# Patient Record
Sex: Female | Born: 1940 | Race: White | Hispanic: No | Marital: Married | Smoking: Never smoker
Health system: Southern US, Community
[De-identification: ages and names within clinical notes are randomized; demographics above are authoritative.]

## PROBLEM LIST (undated history)

## (undated) DIAGNOSIS — F32A Depression, unspecified: Secondary | ICD-10-CM

## (undated) DIAGNOSIS — R0600 Dyspnea, unspecified: Secondary | ICD-10-CM

## (undated) DIAGNOSIS — M797 Fibromyalgia: Secondary | ICD-10-CM

## (undated) DIAGNOSIS — C50919 Malignant neoplasm of unspecified site of unspecified female breast: Secondary | ICD-10-CM

## (undated) DIAGNOSIS — R600 Localized edema: Secondary | ICD-10-CM

## (undated) DIAGNOSIS — F329 Major depressive disorder, single episode, unspecified: Secondary | ICD-10-CM

## (undated) DIAGNOSIS — F419 Anxiety disorder, unspecified: Secondary | ICD-10-CM

## (undated) DIAGNOSIS — R5382 Chronic fatigue, unspecified: Secondary | ICD-10-CM

## (undated) DIAGNOSIS — R0609 Other forms of dyspnea: Secondary | ICD-10-CM

## (undated) DIAGNOSIS — E785 Hyperlipidemia, unspecified: Secondary | ICD-10-CM

## (undated) DIAGNOSIS — K219 Gastro-esophageal reflux disease without esophagitis: Secondary | ICD-10-CM

## (undated) DIAGNOSIS — R079 Chest pain, unspecified: Secondary | ICD-10-CM

## (undated) HISTORY — DX: Malignant neoplasm of unspecified site of unspecified female breast: C50.919

## (undated) HISTORY — DX: Chest pain, unspecified: R07.9

## (undated) HISTORY — DX: Gastro-esophageal reflux disease without esophagitis: K21.9

## (undated) HISTORY — DX: Hyperlipidemia, unspecified: E78.5

## (undated) HISTORY — PX: OTHER SURGICAL HISTORY: SHX169

## (undated) HISTORY — DX: Fibromyalgia: M79.7

## (undated) HISTORY — DX: Chronic fatigue, unspecified: R53.82

## (undated) HISTORY — PX: TONSILLECTOMY: SUR1361

## (undated) HISTORY — DX: Other forms of dyspnea: R06.09

## (undated) HISTORY — DX: Dyspnea, unspecified: R06.00

## (undated) HISTORY — DX: Localized edema: R60.0

---

## 1962-11-10 HISTORY — PX: APPENDECTOMY: SHX54

## 1997-11-10 HISTORY — PX: MASTECTOMY: SHX3

## 2003-01-19 ENCOUNTER — Other Ambulatory Visit: Admission: RE | Admit: 2003-01-19 | Discharge: 2003-01-19 | Payer: Self-pay | Admitting: Dermatology

## 2004-02-20 ENCOUNTER — Encounter: Admission: RE | Admit: 2004-02-20 | Discharge: 2004-02-20 | Payer: Self-pay | Admitting: Internal Medicine

## 2009-02-16 ENCOUNTER — Encounter: Admission: RE | Admit: 2009-02-16 | Discharge: 2009-02-16 | Payer: Self-pay | Admitting: Internal Medicine

## 2009-05-15 ENCOUNTER — Encounter: Payer: Self-pay | Admitting: *Deleted

## 2010-03-05 ENCOUNTER — Encounter: Admission: RE | Admit: 2010-03-05 | Discharge: 2010-03-05 | Payer: Self-pay | Admitting: Internal Medicine

## 2010-10-18 ENCOUNTER — Ambulatory Visit (HOSPITAL_COMMUNITY)
Admission: RE | Admit: 2010-10-18 | Discharge: 2010-10-18 | Payer: Self-pay | Source: Home / Self Care | Attending: Internal Medicine | Admitting: Internal Medicine

## 2011-02-24 ENCOUNTER — Other Ambulatory Visit: Payer: Self-pay | Admitting: Internal Medicine

## 2011-02-24 DIAGNOSIS — Z1231 Encounter for screening mammogram for malignant neoplasm of breast: Secondary | ICD-10-CM

## 2011-02-24 DIAGNOSIS — Z9010 Acquired absence of unspecified breast and nipple: Secondary | ICD-10-CM

## 2011-03-17 ENCOUNTER — Ambulatory Visit
Admission: RE | Admit: 2011-03-17 | Discharge: 2011-03-17 | Disposition: A | Discharge status: Home / Self Care | Payer: Medicare Other | Source: Ambulatory Visit | Attending: Internal Medicine | Admitting: Internal Medicine

## 2011-03-17 DIAGNOSIS — Z1231 Encounter for screening mammogram for malignant neoplasm of breast: Secondary | ICD-10-CM

## 2011-03-17 DIAGNOSIS — Z9010 Acquired absence of unspecified breast and nipple: Secondary | ICD-10-CM

## 2012-02-06 ENCOUNTER — Encounter: Payer: Self-pay | Admitting: *Deleted

## 2012-02-11 ENCOUNTER — Encounter (HOSPITAL_COMMUNITY)
Admission: RE | Admit: 2012-02-11 | Discharge: 2012-02-11 | Disposition: A | Discharge status: Home / Self Care | Payer: Medicare Other | Source: Ambulatory Visit | Attending: Internal Medicine | Admitting: Internal Medicine

## 2012-02-11 ENCOUNTER — Encounter (HOSPITAL_COMMUNITY): Payer: Self-pay

## 2012-02-11 DIAGNOSIS — M899 Disorder of bone, unspecified: Secondary | ICD-10-CM | POA: Insufficient documentation

## 2012-02-11 MED ORDER — ZOLEDRONIC ACID 5 MG/100ML IV SOLN
5.0000 mg | Freq: Once | INTRAVENOUS | Status: AC
  Administered 2012-02-11: 5 mg via INTRAVENOUS

## 2012-02-11 MED ORDER — SODIUM CHLORIDE 0.9 % IV SOLN
INTRAVENOUS | Status: AC
  Administered 2012-02-11: 250 mL via INTRAVENOUS

## 2012-02-11 NOTE — Discharge Instructions (Signed)
Drink fluids/water as tolerated over next 72hrs Tylenol or Ibuprofen OTC as directed Continue calcium and Vit D as directed by your MD 

## 2012-08-18 ENCOUNTER — Other Ambulatory Visit: Payer: Self-pay | Admitting: Internal Medicine

## 2012-08-18 DIAGNOSIS — Z1231 Encounter for screening mammogram for malignant neoplasm of breast: Secondary | ICD-10-CM

## 2012-08-18 DIAGNOSIS — Z9011 Acquired absence of right breast and nipple: Secondary | ICD-10-CM

## 2012-08-24 ENCOUNTER — Other Ambulatory Visit: Payer: Self-pay | Admitting: Internal Medicine

## 2012-08-24 ENCOUNTER — Ambulatory Visit (HOSPITAL_COMMUNITY)
Admission: RE | Admit: 2012-08-24 | Discharge: 2012-08-24 | Disposition: A | Discharge status: Home / Self Care | Payer: Medicare Other | Source: Ambulatory Visit | Attending: Internal Medicine | Admitting: Internal Medicine

## 2012-08-24 DIAGNOSIS — Z9011 Acquired absence of right breast and nipple: Secondary | ICD-10-CM

## 2012-08-24 DIAGNOSIS — Z1231 Encounter for screening mammogram for malignant neoplasm of breast: Secondary | ICD-10-CM

## 2012-10-04 ENCOUNTER — Encounter: Payer: Self-pay | Admitting: Cardiovascular Disease

## 2012-10-04 ENCOUNTER — Ambulatory Visit (INDEPENDENT_AMBULATORY_CARE_PROVIDER_SITE_OTHER): Payer: Medicare Other | Admitting: Cardiovascular Disease

## 2012-10-04 VITALS — BP 122/82 | HR 81 | Ht 65.5 in | Wt 135.0 lb

## 2012-10-04 DIAGNOSIS — R079 Chest pain, unspecified: Secondary | ICD-10-CM

## 2012-10-04 DIAGNOSIS — R0789 Other chest pain: Secondary | ICD-10-CM

## 2012-10-04 NOTE — Patient Instructions (Addendum)
Your physician has requested that you have a stress echocardiogram.  Please follow instruction sheet as given.   Your physician recommends that you schedule a follow-up appointment in: 1 month   Your physician recommends that you continue on your current medications as directed. Please refer to the Current Medication list given to you today.

## 2012-10-04 NOTE — Progress Notes (Signed)
Berline Lopes Date of Birth  December 16, 1940       [facility]    [place] [address], [address]  [address] [place], [place]  [address]   [place], [place]  [address] [phone]     [phone]   Fax  [phone]    Fax [phone]  Problem List: 1. Chest pain 2. Hyperlipidemia 3. Hx of breast cancer 4. Fibromyalgia   History of Present Illness:  Diamond Bowers is a 71 year old female who presents today for further evaluation of some chest pain. She was seen in 2010 by Dr. Reyes Ivan. She had a negative stress echocardiogram at that time.  These occur typically at rest.  The pains tend to radiate up to her jaw and into her arm.  The pains last up to an hour.  They are not associated with exertion.  They are not associated with eating or drinking.  She has GERD and avoids tomatoes and spicy foods.   Current Outpatient Prescriptions on File Prior to Visit  Medication Sig Dispense Refill  . atorvastatin (LIPITOR) 40 MG tablet Take 40 mg by mouth daily.      . Beclomethasone Dipropionate (QVAR IN) Inhale 80 mcg into the lungs.      . budesonide-formoterol (SYMBICORT) 160-4.5 MCG/ACT inhaler Inhale 2 puffs into the lungs 2 (two) times daily.      . calcium-vitamin D (OSCAL WITH D) 500-200 MG-UNIT per tablet Take 1 tablet by mouth 2 (two) times daily.      . colesevelam (WELCHOL) 625 MG tablet Take 1,875 mg by mouth 2 (two) times daily with a meal.      . esomeprazole (NEXIUM) 40 MG capsule Take 40 mg by mouth daily before breakfast.      . fexofenadine (ALLEGRA) 180 MG tablet Take 180 mg by mouth daily.      . fish oil-omega-3 fatty acids 1000 MG capsule Take 1 g by mouth 2 (two) times daily.      . montelukast (SINGULAIR) 10 MG tablet Take 10 mg by mouth at bedtime.      . Multiple Vitamin (MULTIVITAMIN) capsule Take 1 capsule by mouth daily.      Marland Kitchen zolpidem (AMBIEN) 10 MG tablet Take 10 mg by mouth at bedtime as needed.        Allergies  Allergen  Reactions  . Statins     Past Medical History  Diagnosis Date  . Asthma   . GERD (gastroesophageal reflux disease)   . Dyslipidemia   . Fibromyalgia   . Breast cancer     H/O, s/p RIGHT MASTECTOMY  . Chest pain, unspecified   . Dyspnea   . Dyspnea on exertion   . Chronic fatigue   . Edema of lower extremity   . Osteoporosis     Past Surgical History  Procedure Date  . Mastectomy     RIGHT BREAST    History  Smoking status  . Unknown If Ever Smoked  Smokeless tobacco  . Not on file    History  Alcohol Use     Family History  Problem Relation Age of Onset  . Stroke Mother   . Hypertension Mother   . Heart disease Father 71    Reviw of Systems:  Reviewed in the HPI.  All other systems are negative.  Physical Exam: Blood pressure 122/82, pulse 81, height 5' 5.5" (1.664 m), weight 135 lb (61.236 kg), SpO2 98.00%. General: Well developed, well nourished, in no  acute distress.  Head: Normocephalic, atraumatic, sclera non-icteric, mucus membranes are moist,   Neck: Supple. Carotids are 2 + without bruits. No JVD   Lungs: Clear   Heart: RR, S1, S2, no significant murmur  Abdomen: Soft, non-tender, non-distended with normal bowel sounds.  Msk:  Strength and tone are normal  Extremities: No clubbing or cyanosis. No edema.  Distal pedal pulses are 2+ and equal    Neuro: CN II - XII intact.  Alert and oriented X 3.   Psych:  Normal   ECG: Nov. 25, 2013 - NSR at 70. No ST or T wave changes  Assessment / Plan:

## 2012-10-04 NOTE — Assessment & Plan Note (Signed)
Destry presents with recurrent chest tightness.  She has had a negative stress echo in the past.  Her resting ECG shows no abnormalities.   Given the character of the chest discomfort, we will order a stress echocardiogram.  She exercises regularly and should be able to do a stress test without any significant difficulty.   I will see her in 1 month for follow up.

## 2012-10-28 ENCOUNTER — Other Ambulatory Visit: Payer: Self-pay

## 2012-10-28 ENCOUNTER — Other Ambulatory Visit: Payer: Self-pay | Admitting: *Deleted

## 2012-10-28 DIAGNOSIS — R079 Chest pain, unspecified: Secondary | ICD-10-CM

## 2012-10-28 DIAGNOSIS — R0789 Other chest pain: Secondary | ICD-10-CM

## 2012-10-29 ENCOUNTER — Other Ambulatory Visit (HOSPITAL_COMMUNITY): Payer: Medicare Other

## 2012-10-29 ENCOUNTER — Ambulatory Visit (HOSPITAL_COMMUNITY): Payer: Medicare Other | Attending: Cardiology

## 2012-10-29 DIAGNOSIS — Z853 Personal history of malignant neoplasm of breast: Secondary | ICD-10-CM | POA: Insufficient documentation

## 2012-10-29 DIAGNOSIS — R5381 Other malaise: Secondary | ICD-10-CM | POA: Insufficient documentation

## 2012-10-29 DIAGNOSIS — R5383 Other fatigue: Secondary | ICD-10-CM | POA: Insufficient documentation

## 2012-10-29 DIAGNOSIS — R0789 Other chest pain: Secondary | ICD-10-CM

## 2012-10-29 DIAGNOSIS — E785 Hyperlipidemia, unspecified: Secondary | ICD-10-CM | POA: Insufficient documentation

## 2012-10-29 DIAGNOSIS — R609 Edema, unspecified: Secondary | ICD-10-CM | POA: Insufficient documentation

## 2012-10-29 DIAGNOSIS — IMO0001 Reserved for inherently not codable concepts without codable children: Secondary | ICD-10-CM | POA: Insufficient documentation

## 2012-10-29 DIAGNOSIS — Z9010 Acquired absence of unspecified breast and nipple: Secondary | ICD-10-CM | POA: Insufficient documentation

## 2012-10-29 DIAGNOSIS — R079 Chest pain, unspecified: Secondary | ICD-10-CM

## 2012-10-29 DIAGNOSIS — I359 Nonrheumatic aortic valve disorder, unspecified: Secondary | ICD-10-CM | POA: Insufficient documentation

## 2012-10-29 DIAGNOSIS — R072 Precordial pain: Secondary | ICD-10-CM

## 2012-10-29 NOTE — Progress Notes (Signed)
Echocardiogram performed.  

## 2012-11-08 ENCOUNTER — Ambulatory Visit (INDEPENDENT_AMBULATORY_CARE_PROVIDER_SITE_OTHER): Payer: Medicare Other | Admitting: Physician Assistant

## 2012-11-08 DIAGNOSIS — R9439 Abnormal result of other cardiovascular function study: Secondary | ICD-10-CM

## 2012-11-08 DIAGNOSIS — R079 Chest pain, unspecified: Secondary | ICD-10-CM

## 2012-11-08 DIAGNOSIS — R0789 Other chest pain: Secondary | ICD-10-CM

## 2012-11-08 NOTE — Patient Instructions (Addendum)
Your physician has requested that you have a lexiscan myoview. For further information please visit [email or web address] Please follow instruction sheet, as given. HAVE STRESS TEST DONE BEFORE APPOINTMENT WITH DR. Elease Hashimoto PER SCOTT WEAVER  KEEP APPOINTMENT WITH DR. Elease Hashimoto

## 2012-11-08 NOTE — Progress Notes (Signed)
Exercise Treadmill Test  Pre-Exercise Testing Evaluation Rhythm: normal sinus  Rate: 74                 Test  Exercise Tolerance Test Ordering MD: Rodrigo Ran, MD  Interpreting MD: Tereso Newcomer, PA-C  Unique Test No: 1  Treadmill:  1  Indication for ETT: chest pain - rule out ischemia  Contraindication to ETT: No   Stress Modality: exercise - treadmill  Cardiac Imaging Performed: non   Protocol: standard Bruce - maximal  Max BP:  225/102  Max MPHR (bpm):  147 85% MPR (bpm):  125  MPHR obtained (bpm):  142 % MPHR obtained:  96  Reached 85% MPHR (min:sec):  1:10 Total Exercise Time (min-sec):  2:00  Workload in METS:  4.4 Borg Scale: 15  Reason ETT Terminated:  fatigue    ST Segment Analysis At Rest: normal ST segments - no evidence of significant ST depression With Exercise: borderline ST changes  Other Information Arrhythmia:  No Angina during ETT:  absent (0) Quality of ETT:  indeterminate  ETT Interpretation:  borderline (indeterminate) with non-specific ST changes  Comments: Poor exercise tolerance. No chest pain.  She did complain of dyspnea. Hypertensive BP response to exercise.  Baseline BP elevated 168/98. Borderline ST depression in inf-lat lead.  Cannot rule out ischemia.   Recommendations: Schedule Lexiscan Myoview. Suspect she has "white coat HTN." Keep follow up with Dr. Delane Ginger next week. Luna Glasgow, PA-C  11:06 AM 11/08/2012

## 2012-11-09 ENCOUNTER — Ambulatory Visit (HOSPITAL_COMMUNITY): Payer: Medicare Other | Attending: Cardiovascular Disease | Admitting: Radiology

## 2012-11-09 VITALS — BP 157/93 | Ht 65.0 in | Wt 139.0 lb

## 2012-11-09 DIAGNOSIS — R079 Chest pain, unspecified: Secondary | ICD-10-CM

## 2012-11-09 DIAGNOSIS — R42 Dizziness and giddiness: Secondary | ICD-10-CM | POA: Insufficient documentation

## 2012-11-09 DIAGNOSIS — R0602 Shortness of breath: Secondary | ICD-10-CM

## 2012-11-09 DIAGNOSIS — R0609 Other forms of dyspnea: Secondary | ICD-10-CM | POA: Insufficient documentation

## 2012-11-09 DIAGNOSIS — R0989 Other specified symptoms and signs involving the circulatory and respiratory systems: Secondary | ICD-10-CM | POA: Insufficient documentation

## 2012-11-09 DIAGNOSIS — J45909 Unspecified asthma, uncomplicated: Secondary | ICD-10-CM | POA: Insufficient documentation

## 2012-11-09 DIAGNOSIS — R9439 Abnormal result of other cardiovascular function study: Secondary | ICD-10-CM

## 2012-11-09 MED ORDER — REGADENOSON 0.4 MG/5ML IV SOLN
0.4000 mg | Freq: Once | INTRAVENOUS | Status: AC
  Administered 2012-11-09: 0.4 mg via INTRAVENOUS

## 2012-11-09 MED ORDER — TECHNETIUM TC 99M SESTAMIBI GENERIC - CARDIOLITE
10.0000 | Freq: Once | INTRAVENOUS | Status: AC | PRN
  Administered 2012-11-09: 10 via INTRAVENOUS

## 2012-11-09 MED ORDER — TECHNETIUM TC 99M SESTAMIBI GENERIC - CARDIOLITE
30.0000 | Freq: Once | INTRAVENOUS | Status: AC | PRN
  Administered 2012-11-09: 30 via INTRAVENOUS

## 2012-11-09 NOTE — Progress Notes (Signed)
[facility] SITE [address] NUCLEAR MED [address] [place], [place] [address] [phone]    Cardiology Nuclear Med Study  KENNETTE CUTHRELL is a 71 y.o. female     MRN : [id number]     DOB: May 20, 1941  Procedure Date: 11/09/2012  Nuclear Med Background Indication for Stress Test:  Evaluation for Ischemia and Abnormal GXT History:  12/13 Echo-EF 55-65%; 12/13 GXT-poor exerc. Morene Antu; hx R Breast Ca;Asthma Cardiac Risk Factors: Lipids  Symptoms:  Chest Pain (last date of chest discomfort now 3/10),Jaw pain(last date of pain last night), Diaphoresis, Dizziness, DOE and Fatigue   Nuclear Pre-Procedure Caffeine/Decaff Intake:  None NPO After: 6:00pm   Lungs:  clear O2 Sat: 98% on room air. IV 0.9% NS with Angio Cath:  22g  IV Site: L Antecubital  IV Started by:  Doyne Keel, CNMT  Chest Size (in):  34 Cup Size: B  Height: 5\' 5"  (1.651 m)  Weight:  139 lb (63.05 kg)  BMI:  Body mass index is 23.13 kg/(m^2). Tech Comments:  Held all am meds; patient has right side breast implant    Nuclear Med Study 1 or 2 day study: 1 day  Stress Test Type:  Treadmill/Lexiscan  Reading MD: Kristeen Miss, MD  Order Authorizing Provider:  Katherina Right, MD  Resting Radionuclide: Technetium 98m Sestamibi  Resting Radionuclide Dose: 11.0 mCi   Stress Radionuclide:  Technetium 57m Sestamibi  Stress Radionuclide Dose: 33.0 mCi           Stress Protocol Rest HR: 77 Stress HR: 129  Rest BP: 157/93 Stress BP: 184/86  Exercise Time (min): 2:00 METS: 1.6   Predicted Max HR: 147 bpm % Max HR: 87.76 bpm Rate Pressure Product: 21308    Dose of Adenosine (mg):  n/a Dose of Lexiscan: 0.4 mg  Dose of Atropine (mg): n/a Dose of Dobutamine: n/a mcg/kg/min (at max HR)  Stress Test Technologist: Cathlyn Parsons, RN  Nuclear Technologist:  Doyne Keel, CNMT     Rest Procedure:  Myocardial perfusion imaging was performed at rest 45 minutes following the intravenous administration of Technetium 52m  Sestamibi. Rest ECG: NSR - Normal EKG  Stress Procedure:  The patient received IV Lexiscan 0.4 mg over 15-seconds with concurrent low level exercise and then Technetium 46m Sestamibi was injected at 30-seconds while the patient continued walking one more minute.  Quantitative spect images were obtained after a 45-minute delay. Stress ECG: No significant change from baseline ECG  QPS Raw Data Images:  Normal; no motion artifact; normal heart/lung ratio. Stress Images:  Normal homogeneous uptake in all areas of the myocardium. Rest Images:  Normal homogeneous uptake in all areas of the myocardium. Subtraction (SDS):  No evidence of ischemia. Transient Ischemic Dilatation (Normal <1.22):  1.15 Lung/Heart Ratio (Normal <0.45):  0.29  Quantitative Gated Spect Images QGS EDV:  62 ml QGS ESV:  19 ml  Impression Exercise Capacity:  Lexiscan with no exercise. BP Response:  Normal blood pressure response. Clinical Symptoms:  No significant symptoms noted. ECG Impression:  No significant ST segment change suggestive of ischemia. Comparison with Prior Nuclear Study: No images to compare  Overall Impression:  Normal stress nuclear study.  No evidence of ischemia.    LV Ejection Fraction: 69%.  LV Wall Motion:  NL LV Function; NL Wall Motion.    Vesta Mixer, Montez Hageman., MD, [facility] 11/09/2012, 4:03 PM Office - [phone] Pager [phone]

## 2012-11-12 ENCOUNTER — Encounter: Payer: Self-pay | Admitting: Physician Assistant

## 2012-11-15 ENCOUNTER — Encounter: Payer: Self-pay | Admitting: Cardiovascular Disease

## 2012-11-15 ENCOUNTER — Ambulatory Visit (INDEPENDENT_AMBULATORY_CARE_PROVIDER_SITE_OTHER): Payer: Medicare Other | Admitting: Cardiovascular Disease

## 2012-11-15 VITALS — BP 149/78 | HR 78 | Ht 65.0 in | Wt 139.0 lb

## 2012-11-15 DIAGNOSIS — R0789 Other chest pain: Secondary | ICD-10-CM

## 2012-11-15 NOTE — Patient Instructions (Addendum)
Your physician recommends that you schedule a follow-up appointment in: as needed basis, make follow up appointments with primary care

## 2012-11-15 NOTE — Assessment & Plan Note (Signed)
Diamond Bowers is doing well. She had a stress Myoview study that was normal. She also had an echocardiogram that was essentially normal except for some mild left ventricular hypertrophy.  She has a history of hyperlipidemia managed by Dr. Waynard Edwards. She did not want to take atorvastatin because of the LAD that she's on television. I informed her that Lipitor may cause some increase in blood glucose levels but that it seemed to offer benefit in the reduction of myocardial infarction, death, and stroke. I told her I would leave the final decision up to Dr. Waynard Edwards.  I will see her on an as-needed basis. I'll be happy to see her if she has any recurrent episodes of chest pain.

## 2012-11-15 NOTE — Progress Notes (Signed)
Diamond Bowers Date of Birth  22-May-1941       [facility]    [place] [address]  [address] [place], [place]  [address]   [place], [place]  [address] [phone]     [phone]   Fax  [phone]    Fax [phone]  Problem List: 1. Chest pain 2. Hyperlipidemia 3. Hx of breast cancer 4. Fibromyalgia   History of Present Illness:  Diamond Bowers is a 72 year old female who presents today for further evaluation of some chest pain. She was seen in 2010 by Dr. Reyes Ivan. She had a negative stress echocardiogram at that time.    November 16, 2011: She had been having some episodes of chest pain. A stress Myoview study was normal. She also had an echocardiogram that revealed mild left ventricular hypertrophy but was otherwise normal. She has a history of hyperlipidemia. We tried her on some Lipitor but she discontinued it.  She had seen a TV add and did not want to take it.   She's feeling quite well at this point. She's not having any further episodes of chest pain.  Current Outpatient Prescriptions on File Prior to Visit  Medication Sig Dispense Refill  . Beclomethasone Dipropionate (QVAR IN) Inhale 80 mcg into the lungs.      . budesonide-formoterol (SYMBICORT) 160-4.5 MCG/ACT inhaler Inhale 2 puffs into the lungs 2 (two) times daily.      . calcium-vitamin D (OSCAL WITH D) 500-200 MG-UNIT per tablet Take 1 tablet by mouth 2 (two) times daily.      . colesevelam (WELCHOL) 625 MG tablet Take 1,875 mg by mouth 2 (two) times daily with a meal.      . esomeprazole (NEXIUM) 40 MG capsule Take 40 mg by mouth daily before breakfast.      . fexofenadine (ALLEGRA) 180 MG tablet Take 180 mg by mouth daily.      . fish oil-omega-3 fatty acids 1000 MG capsule Take 1 g by mouth 2 (two) times daily.      . montelukast (SINGULAIR) 10 MG tablet Take 10 mg by mouth at bedtime.      . Multiple Vitamin (MULTIVITAMIN) capsule Take 1 capsule by mouth daily.       Marland Kitchen zolpidem (AMBIEN) 10 MG tablet Take 10 mg by mouth at bedtime as needed.        Allergies  Allergen Reactions  . Statins     Past Medical History  Diagnosis Date  . Asthma   . GERD (gastroesophageal reflux disease)   . Dyslipidemia   . Fibromyalgia   . Breast cancer     H/O, s/p RIGHT MASTECTOMY  . Chest pain, unspecified     ETT/Lex MV 1/14:  EF 69%, no ischemia  . Dyspnea   . Dyspnea on exertion   . Chronic fatigue   . Edema of lower extremity   . Osteoporosis     Past Surgical History  Procedure Date  . Mastectomy     RIGHT BREAST    History  Smoking status  . Never Smoker   Smokeless tobacco  . Not on file    History  Alcohol Use: Not on file    Family History  Problem Relation Age of Onset  . Stroke Mother   . Hypertension Mother   . Heart disease Father 55    Reviw of Systems:  Reviewed in the HPI.  All other systems are negative.  Physical Exam:  Blood pressure 149/78, pulse 78, height 5\' 5"  (1.651 m), weight 139 lb (63.05 kg). General: Well developed, well nourished, in no acute distress.  Head: Normocephalic, atraumatic, sclera non-icteric, mucus membranes are moist,   Neck: Supple. Carotids are 2 + without bruits. No JVD   Lungs: Clear   Heart: RR, S1, S2, no significant murmur  Abdomen: Soft, non-tender, non-distended with normal bowel sounds.  Msk:  Strength and tone are normal  Extremities: No clubbing or cyanosis. No edema.  Distal pedal pulses are 2+ and equal    Neuro: CN II - XII intact.  Alert and oriented X 3.   Psych:  Normal   ECG: Nov. 25, 2013 - NSR at 70. No ST or T wave changes  Assessment / Plan:

## 2013-08-08 ENCOUNTER — Other Ambulatory Visit: Payer: Self-pay

## 2013-08-08 DIAGNOSIS — Z9011 Acquired absence of right breast and nipple: Secondary | ICD-10-CM

## 2013-08-08 DIAGNOSIS — Z853 Personal history of malignant neoplasm of breast: Secondary | ICD-10-CM

## 2013-08-08 DIAGNOSIS — Z1231 Encounter for screening mammogram for malignant neoplasm of breast: Secondary | ICD-10-CM

## 2013-09-07 ENCOUNTER — Ambulatory Visit
Admission: RE | Admit: 2013-09-07 | Discharge: 2013-09-07 | Disposition: A | Discharge status: Home / Self Care | Payer: Medicare Other | Source: Ambulatory Visit

## 2013-09-07 DIAGNOSIS — Z9011 Acquired absence of right breast and nipple: Secondary | ICD-10-CM

## 2013-09-07 DIAGNOSIS — Z853 Personal history of malignant neoplasm of breast: Secondary | ICD-10-CM

## 2013-09-07 DIAGNOSIS — Z1231 Encounter for screening mammogram for malignant neoplasm of breast: Secondary | ICD-10-CM

## 2014-05-25 ENCOUNTER — Encounter (HOSPITAL_COMMUNITY): Payer: Self-pay

## 2014-05-25 ENCOUNTER — Encounter (HOSPITAL_COMMUNITY)
Admission: RE | Admit: 2014-05-25 | Discharge: 2014-05-25 | Disposition: A | Discharge status: Home / Self Care | Payer: Medicare Other | Source: Ambulatory Visit | Attending: Internal Medicine | Admitting: Internal Medicine

## 2014-05-25 DIAGNOSIS — M810 Age-related osteoporosis without current pathological fracture: Secondary | ICD-10-CM | POA: Insufficient documentation

## 2014-05-25 MED ORDER — ZOLEDRONIC ACID 5 MG/100ML IV SOLN
5.0000 mg | Freq: Once | INTRAVENOUS | Status: AC
Start: 2014-05-25 — End: 2014-05-25
  Administered 2014-05-25: 5 mg via INTRAVENOUS
  Filled 2014-05-25: qty 100

## 2014-05-25 MED ORDER — SODIUM CHLORIDE 0.9 % IV SOLN
INTRAVENOUS | Status: AC
  Administered 2014-05-25: 250 mL via INTRAVENOUS

## 2014-05-25 NOTE — Discharge Instructions (Signed)
RECLAST °Zoledronic Acid injection (Paget's Disease, Osteoporosis) °What is this medicine? °ZOLEDRONIC ACID (ZOE le dron ik AS id) lowers the amount of calcium loss from bone. It is used to treat Paget's disease and osteoporosis in women. °This medicine may be used for other purposes; ask your health care provider or pharmacist if you have questions. °COMMON BRAND NAME(S): Reclast, Zometa °What should I tell my health care provider before I take this medicine? °They need to know if you have any of these conditions: °-aspirin-sensitive asthma °-cancer, especially if you are receiving medicines used to treat cancer °-dental disease or wear dentures °-infection °-kidney disease °-low levels of calcium in the blood °-past surgery on the parathyroid gland or intestines °-receiving corticosteroids like dexamethasone or prednisone °-an unusual or allergic reaction to zoledronic acid, other medicines, foods, dyes, or preservatives °-pregnant or trying to get pregnant °-breast-feeding °How should I use this medicine? °This medicine is for infusion into a vein. It is given by a health care professional in a hospital or clinic setting. °Talk to your pediatrician regarding the use of this medicine in children. This medicine is not approved for use in children. °Overdosage: If you think you have taken too much of this medicine contact a poison control center or emergency room at once. °NOTE: This medicine is only for you. Do not share this medicine with others. °What if I miss a dose? °It is important not to miss your dose. Call your doctor or health care professional if you are unable to keep an appointment. °What may interact with this medicine? °-certain antibiotics given by injection °-NSAIDs, medicines for pain and inflammation, like ibuprofen or naproxen °-some diuretics like bumetanide, furosemide °-teriparatide °This list may not describe all possible interactions. Give your health care provider a list of all the  medicines, herbs, non-prescription drugs, or dietary supplements you use. Also tell them if you smoke, drink alcohol, or use illegal drugs. Some items may interact with your medicine. °What should I watch for while using this medicine? °Visit your doctor or health care professional for regular checkups. It may be some time before you see the benefit from this medicine. Do not stop taking your medicine unless your doctor tells you to. Your doctor may order blood tests or other tests to see how you are doing. °Women should inform their doctor if they wish to become pregnant or think they might be pregnant. There is a potential for serious side effects to an unborn child. Talk to your health care professional or pharmacist for more information. °You should make sure that you get enough calcium and vitamin D while you are taking this medicine. Discuss the foods you eat and the vitamins you take with your health care professional. °Some people who take this medicine have severe bone, joint, and/or muscle pain. This medicine may also increase your risk for jaw problems or a broken thigh bone. Tell your doctor right away if you have severe pain in your jaw, bones, joints, or muscles. Tell your doctor if you have any pain that does not go away or that gets worse. °Tell your dentist and dental surgeon that you are taking this medicine. You should not have major dental surgery while on this medicine. See your dentist to have a dental exam and fix any dental problems before starting this medicine. Take good care of your teeth while on this medicine. Make sure you see your dentist for regular follow-up appointments. °What side effects may I notice from receiving this   medicine? °Side effects that you should report to your doctor or health care professional as soon as possible: °-allergic reactions like skin rash, itching or hives, swelling of the face, lips, or tongue °-anxiety, confusion, or depression °-breathing  problems °-changes in vision °-eye pain °-feeling faint or lightheaded, falls °-jaw pain, especially after dental work °-mouth sores °-muscle cramps, stiffness, or weakness °-trouble passing urine or change in the amount of urine °Side effects that usually do not require medical attention (report to your doctor or health care professional if they continue or are bothersome): °-bone, joint, or muscle pain °-constipation °-diarrhea °-fever °-hair loss °-irritation at site where injected °-loss of appetite °-nausea, vomiting °-stomach upset °-trouble sleeping °-trouble swallowing °-weak or tired °This list may not describe all possible side effects. Call your doctor for medical advice about side effects. You may report side effects to FDA at 1-800-FDA-1088. °Where should I keep my medicine? °This drug is given in a hospital or clinic and will not be stored at home. °NOTE: This sheet is a summary. It may not cover all possible information. If you have questions about this medicine, talk to your doctor, pharmacist, or health care provider. °© 2015, Elsevier/Gold Standard. (2013-04-11 10:03:48) °Osteoporosis °Throughout your life, your body breaks down old bone and replaces it with new bone. As you get older, your body does not replace bone as quickly as it breaks it down. By the age of 30 years, most people begin to gradually lose bone because of the imbalance between bone loss and replacement. Some people lose more bone than others. Bone loss beyond a specified normal degree is considered osteoporosis.  °Osteoporosis affects the strength and durability of your bones. The inside of the ends of your bones and your flat bones, like the bones of your pelvis, look like honeycomb, filled with tiny open spaces. As bone loss occurs, your bones become less dense. This means that the open spaces inside your bones become bigger and the walls between these spaces become thinner. This makes your bones weaker. Bones of a person with  osteoporosis can become so weak that they can break (fracture) during minor accidents, such as a simple fall. °CAUSES  °The following factors have been associated with the development of osteoporosis: °· Smoking. °· Drinking more than 2 alcoholic drinks several days per week. °· Long-term use of certain medicines: °¨ Corticosteroids. °¨ Chemotherapy medicines. °¨ Thyroid medicines. °¨ Antiepileptic medicines. °¨ Gonadal hormone suppression medicine. °¨ Immunosuppression medicine. °· Being underweight. °· Lack of physical activity. °· Lack of exposure to the sun. This can lead to vitamin D deficiency. °· Certain medical conditions: °¨ Certain inflammatory bowel diseases, such as Crohn disease and ulcerative colitis. °¨ Diabetes. °¨ Hyperthyroidism. °¨ Hyperparathyroidism. °RISK FACTORS °Anyone can develop osteoporosis. However, the following factors can increase your risk of developing osteoporosis: °· Gender--Women are at higher risk than men. °· Age--Being older than 50 years increases your risk. °· Ethnicity--White and Asian people have an increased risk. °· Weight --Being extremely underweight can increase your risk of osteoporosis. °· Family history of osteoporosis--Having a family member who has developed osteoporosis can increase your risk. °SYMPTOMS  °Usually, people with osteoporosis have no symptoms.  °DIAGNOSIS  °Signs during a physical exam that may prompt your caregiver to suspect osteoporosis include: °· Decreased height. This is usually caused by the compression of the bones that form your spine (vertebrae) because they have weakened and become fractured. °· A curving or rounding of the upper back (kyphosis). °  To confirm signs of osteoporosis, your caregiver may request a procedure that uses 2 low-dose X-ray beams with different levels of energy to measure your bone mineral density (dual-energy X-ray absorptiometry [DXA]). Also, your caregiver may check your level of vitamin D. °TREATMENT  °The goal of  osteoporosis treatment is to strengthen bones in order to decrease the risk of bone fractures. There are different types of medicines available to help achieve this goal. Some of these medicines work by slowing the processes of bone loss. Some medicines work by increasing bone density. Treatment also involves making sure that your levels of calcium and vitamin D are adequate. °PREVENTION  °There are things you can do to help prevent osteoporosis. Adequate intake of calcium and vitamin D can help you achieve optimal bone mineral density. Regular exercise can also help, especially resistance and weight-bearing activities. If you smoke, quitting smoking is an important part of osteoporosis prevention. °MAKE SURE YOU: °· Understand these instructions. °· Will watch your condition. °· Will get help right away if you are not doing well or get worse. °FOR MORE INFORMATION °www.osteo.org and www.nof.org °Document Released: 08/06/2005 Document Revised: 02/21/2013 Document Reviewed: 10/11/2011 °ExitCare® Patient Information ©2015 [employer]. This information is not intended to replace advice given to you by your health care provider. Make sure you discuss any questions you have with your health care provider. ° ° °

## 2014-09-20 ENCOUNTER — Other Ambulatory Visit (HOSPITAL_COMMUNITY): Payer: Self-pay | Admitting: Internal Medicine

## 2014-09-20 DIAGNOSIS — Z1231 Encounter for screening mammogram for malignant neoplasm of breast: Secondary | ICD-10-CM

## 2014-09-22 ENCOUNTER — Ambulatory Visit (HOSPITAL_COMMUNITY)
Admission: RE | Admit: 2014-09-22 | Discharge: 2014-09-22 | Disposition: A | Discharge status: Home / Self Care | Payer: Medicare Other | Source: Ambulatory Visit | Attending: Internal Medicine | Admitting: Internal Medicine

## 2014-09-22 DIAGNOSIS — Z1231 Encounter for screening mammogram for malignant neoplasm of breast: Secondary | ICD-10-CM | POA: Diagnosis present

## 2015-08-11 HISTORY — PX: LUMBAR DISC SURGERY: SHX700

## 2015-11-12 ENCOUNTER — Other Ambulatory Visit (HOSPITAL_COMMUNITY): Payer: Self-pay | Admitting: Internal Medicine

## 2015-11-12 DIAGNOSIS — Z1231 Encounter for screening mammogram for malignant neoplasm of breast: Secondary | ICD-10-CM

## 2015-11-14 ENCOUNTER — Other Ambulatory Visit (HOSPITAL_COMMUNITY): Payer: Self-pay | Admitting: Internal Medicine

## 2015-11-14 ENCOUNTER — Ambulatory Visit (HOSPITAL_COMMUNITY)
Admission: RE | Admit: 2015-11-14 | Discharge: 2015-11-14 | Disposition: A | Discharge status: Home / Self Care | Payer: Medicare PPO | Source: Ambulatory Visit | Attending: Internal Medicine | Admitting: Internal Medicine

## 2015-11-14 ENCOUNTER — Ambulatory Visit (HOSPITAL_COMMUNITY): Payer: Self-pay

## 2015-11-14 DIAGNOSIS — Z1231 Encounter for screening mammogram for malignant neoplasm of breast: Secondary | ICD-10-CM

## 2016-03-03 ENCOUNTER — Other Ambulatory Visit (HOSPITAL_COMMUNITY): Payer: Self-pay | Admitting: *Deleted

## 2016-03-04 ENCOUNTER — Ambulatory Visit (HOSPITAL_COMMUNITY): Admission: RE | Admit: 2016-03-04 | Payer: Medicare PPO | Source: Ambulatory Visit

## 2016-03-06 ENCOUNTER — Other Ambulatory Visit (HOSPITAL_COMMUNITY): Payer: Self-pay | Admitting: *Deleted

## 2016-03-07 ENCOUNTER — Ambulatory Visit (HOSPITAL_COMMUNITY)
Admission: RE | Admit: 2016-03-07 | Discharge: 2016-03-07 | Disposition: A | Discharge status: Home / Self Care | Payer: Medicare PPO | Source: Ambulatory Visit | Attending: Internal Medicine | Admitting: Internal Medicine

## 2016-03-07 DIAGNOSIS — M810 Age-related osteoporosis without current pathological fracture: Secondary | ICD-10-CM | POA: Diagnosis not present

## 2016-03-07 MED ORDER — ZOLEDRONIC ACID 5 MG/100ML IV SOLN
INTRAVENOUS | Status: AC
  Administered 2016-03-07: 5 mg via INTRAVENOUS
  Filled 2016-03-07: qty 100

## 2016-03-07 MED ORDER — ZOLEDRONIC ACID 5 MG/100ML IV SOLN
5.0000 mg | Freq: Once | INTRAVENOUS | Status: AC
  Administered 2016-03-07: 5 mg via INTRAVENOUS

## 2016-06-17 ENCOUNTER — Other Ambulatory Visit: Payer: Self-pay | Admitting: Internal Medicine

## 2016-06-17 DIAGNOSIS — I350 Nonrheumatic aortic (valve) stenosis: Secondary | ICD-10-CM

## 2016-06-19 ENCOUNTER — Ambulatory Visit (HOSPITAL_COMMUNITY): Payer: Medicare PPO | Attending: Cardiovascular Disease

## 2016-06-19 ENCOUNTER — Other Ambulatory Visit (HOSPITAL_COMMUNITY): Payer: Self-pay | Admitting: Internal Medicine

## 2016-06-19 ENCOUNTER — Other Ambulatory Visit: Payer: Self-pay

## 2016-06-19 DIAGNOSIS — I359 Nonrheumatic aortic valve disorder, unspecified: Secondary | ICD-10-CM | POA: Insufficient documentation

## 2016-06-19 DIAGNOSIS — I351 Nonrheumatic aortic (valve) insufficiency: Secondary | ICD-10-CM | POA: Insufficient documentation

## 2016-06-19 DIAGNOSIS — I059 Rheumatic mitral valve disease, unspecified: Secondary | ICD-10-CM | POA: Diagnosis not present

## 2017-07-31 LAB — IFOBT (OCCULT BLOOD): IMMUNOLOGICAL FECAL OCCULT BLOOD TEST: NEGATIVE

## 2017-08-10 LAB — COLOGUARD

## 2017-08-11 ENCOUNTER — Encounter: Payer: Self-pay | Admitting: Gastroenterology

## 2017-08-14 ENCOUNTER — Encounter: Payer: Self-pay | Admitting: Gastroenterology

## 2017-08-14 ENCOUNTER — Encounter (INDEPENDENT_AMBULATORY_CARE_PROVIDER_SITE_OTHER): Payer: Self-pay

## 2017-08-14 ENCOUNTER — Ambulatory Visit (INDEPENDENT_AMBULATORY_CARE_PROVIDER_SITE_OTHER): Payer: Medicare PPO | Admitting: Gastroenterology

## 2017-08-14 VITALS — BP 142/84 | HR 72 | Ht 63.5 in | Wt 137.4 lb

## 2017-08-14 DIAGNOSIS — R195 Other fecal abnormalities: Secondary | ICD-10-CM

## 2017-08-14 NOTE — Patient Instructions (Addendum)
If you are age 78 or older, your body mass index should be between 23-30. Your Body mass index is 23.95 kg/m. If this is out of the aforementioned range listed, please consider follow up with your Primary Care Provider.  If you are age 33 or younger, your body mass index should be between 19-25. Your Body mass index is 23.95 kg/m. If this is out of the aformentioned range listed, please consider follow up with your Primary Care Provider.   It has been recommended to you by your physician that you have a(n)  Colonoscopy completed. Per your request, we did not schedule the procedure(s) today. Please contact our office at [phone] should you decide to have the procedure completed.  Thank you.

## 2017-08-14 NOTE — Progress Notes (Signed)
Agree with assessment and plan as outlined. It is recommended the patient proceed with colonoscopy to clarify Cologuard findings - ensure no colon cancer or adenomas. She declined following her visit with Janett Billow. She can call to schedule with [place] if she reconsiders, or have this done in [place] if she wishes.

## 2017-08-14 NOTE — Progress Notes (Signed)
08/14/2017 Diamond Bowers [id number] Feb 03, 1941   HISTORY OF PRESENT ILLNESS:  This is a 76 year old female who is new to our office. She's been referred here by her PCP, Dr. Joylene Draft, for evaluation regarding a recent positive Cologuard study. The patient and her husband are here today. They are somewhat frustrated by this visit. She had a negative hemosure performed a couple weeks ago, but then a Cologuard study was ordered and was positive.  I explained the differences in the results of the two tests.  Her last colonoscopy was 8-10 years ago.  She denies seeing blood in her stools or having black stools.  Her Hgb is normal.  She moves her bowels regularly, no abdominal pain, essentially no complaints.  They are leaving to go back to [facility] in 2 weeks.      Past Medical History:  Diagnosis Date  . Asthma   . Breast cancer ([place])    H/O, s/p RIGHT MASTECTOMY  . Chest pain, unspecified    ETT/Lex MV 1/14:  EF 69%, no ischemia  . Chronic fatigue   . Dyslipidemia   . Dyspnea   . Dyspnea on exertion   . Edema of lower extremity   . Fibromyalgia   . GERD (gastroesophageal reflux disease)   . Osteoporosis    Past Surgical History:  Procedure Laterality Date  . APPENDECTOMY  1964  . [place] SURGERY  08/2015  . MASTECTOMY Right 1999  . 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 TONSILLECTOMY      reports that she has never smoked. She has never used smokeless tobacco. She reports that she does not drink alcohol or use drugs. family history includes Breast cancer in her paternal grandmother; Heart disease (age of onset: 35) in her father; Hypertension in her mother; Stroke in her mother; Throat cancer in her father. Allergies  Allergen Reactions  . Statins     " I am on them but they makes me hurt but I also have fibromyalgia and it is hard to determine what is causing my pain"       Outpatient Encounter Prescriptions as of 08/14/2017  Medication Sig    . Beclomethasone Dipropionate (QVAR IN) Inhale 80 mcg into the lungs.  . budesonide-formoterol (SYMBICORT) 160-4.5 MCG/ACT inhaler Inhale 2 puffs into the lungs 2 (two) times daily.  . calcium-vitamin D (OSCAL WITH D) 500-200 MG-UNIT per tablet Take 1 tablet by mouth 2 (two) times daily.  Marland Kitchen esomeprazole (NEXIUM) 40 MG capsule Take 40 mg by mouth daily before breakfast.  . ezetimibe (ZETIA) 10 MG tablet Take 1 tablet by mouth daily.  . fexofenadine (ALLEGRA) 180 MG tablet Take 180 mg by mouth daily.  . fish oil-omega-3 fatty acids 1000 MG capsule Take 1 g by mouth 2 (two) times daily.  . montelukast (SINGULAIR) 10 MG tablet Take 10 mg by mouth at bedtime.  . Multiple Vitamin (MULTIVITAMIN) capsule Take 1 capsule by mouth daily.  . rosuvastatin (CRESTOR) 20 MG tablet Take 5 mg by mouth daily.  Marland Kitchen zolpidem (AMBIEN) 10 MG tablet Take 10 mg by mouth at bedtime as needed.  . [DISCONTINUED] colesevelam (WELCHOL) 625 MG tablet Take 1,875 mg by mouth 2 (two) times daily with a meal.   No facility-administered encounter medications on file as of 08/14/2017.      REVIEW OF SYSTEMS  : All other systems reviewed and negative except where noted in the History of Present  Illness.   PHYSICAL EXAM: BP (!) 142/84 (BP Location: Left Arm, Patient Position: Sitting, Cuff Size: Normal)   Pulse 72   Ht 5' 3.5" (1.613 m) Comment: height measured without shoes  Wt 137 lb 6 oz (62.3 kg)   BMI 23.95 kg/m  General: Well developed white female in no acute distress Head: Normocephalic and atraumatic Eyes:  Sclerae anicteric, conjunctiva pink. Ears: Normal auditory acuity Lungs: Clear throughout to auscultation; no increased WOB. Heart: Regular rate and rhythm; no M/R/G. Abdomen: Soft, non-distended.  BS present.  Non-tender. Musculoskeletal: Symmetrical with no gross deformities  Skin: No lesions on visible extremities Extremities: No edema  Neurological: Alert oriented x 4, grossly non-focal Psychological:   Alert and cooperative. Normal mood and affect  ASSESSMENT AND PLAN: *Positive cologuard:  This was ordered by the patient's PCP.  Patient is not interested in colonoscopy and does not want to schedule at this time.  They are going back to [place] in 2 weeks.  I told them that the availability in that 2 week timeframe is very limited so if she decides that she wants to proceed she should call back soon and schedule. I think that Dr. Havery Moros is the only physician with something available within the next 2 weeks. Otherwise if she decides to proceed when she gets to [place] that she could do that as well.  **30 minutes spent with the patient in which 50% was spent explaining results and discussing recommendations and options.     CC:  Crist Infante, MD

## 2018-09-07 ENCOUNTER — Other Ambulatory Visit (HOSPITAL_COMMUNITY): Payer: Self-pay | Admitting: Internal Medicine

## 2018-09-07 DIAGNOSIS — Z1231 Encounter for screening mammogram for malignant neoplasm of breast: Secondary | ICD-10-CM

## 2018-09-13 ENCOUNTER — Other Ambulatory Visit (HOSPITAL_COMMUNITY): Payer: Self-pay

## 2018-09-14 ENCOUNTER — Encounter (HOSPITAL_COMMUNITY): Payer: Medicare PPO

## 2018-09-20 ENCOUNTER — Encounter (HOSPITAL_COMMUNITY): Payer: Self-pay

## 2018-09-20 ENCOUNTER — Ambulatory Visit (HOSPITAL_COMMUNITY)
Admission: RE | Admit: 2018-09-20 | Discharge: 2018-09-20 | Disposition: A | Discharge status: Home / Self Care | Payer: Medicare PPO | Source: Ambulatory Visit | Attending: Internal Medicine | Admitting: Internal Medicine

## 2018-09-20 DIAGNOSIS — Z1231 Encounter for screening mammogram for malignant neoplasm of breast: Secondary | ICD-10-CM | POA: Insufficient documentation

## 2018-09-22 ENCOUNTER — Encounter (HOSPITAL_COMMUNITY)
Admission: RE | Admit: 2018-09-22 | Discharge: 2018-09-22 | Disposition: A | Discharge status: Home / Self Care | Payer: Medicare PPO | Source: Ambulatory Visit | Attending: Internal Medicine | Admitting: Internal Medicine

## 2018-09-22 ENCOUNTER — Other Ambulatory Visit (HOSPITAL_COMMUNITY): Payer: Self-pay | Admitting: Internal Medicine

## 2018-09-22 DIAGNOSIS — R921 Mammographic calcification found on diagnostic imaging of breast: Secondary | ICD-10-CM

## 2018-09-22 DIAGNOSIS — M810 Age-related osteoporosis without current pathological fracture: Secondary | ICD-10-CM | POA: Diagnosis not present

## 2018-09-22 MED ORDER — SODIUM CHLORIDE 0.9 % IV SOLN
Freq: Once | INTRAVENOUS | Status: AC
  Administered 2018-09-22: 250 mL via INTRAVENOUS

## 2018-09-22 MED ORDER — ZOLEDRONIC ACID 5 MG/100ML IV SOLN
INTRAVENOUS | Status: AC
  Filled 2018-09-22: qty 100

## 2018-09-22 MED ORDER — ZOLEDRONIC ACID 5 MG/100ML IV SOLN
5.0000 mg | Freq: Once | INTRAVENOUS | Status: AC
  Administered 2018-09-22: 5 mg via INTRAVENOUS

## 2018-10-05 ENCOUNTER — Ambulatory Visit (HOSPITAL_COMMUNITY)
Admission: RE | Admit: 2018-10-05 | Discharge: 2018-10-05 | Disposition: A | Discharge status: Home / Self Care | Payer: Medicare PPO | Source: Ambulatory Visit | Attending: Internal Medicine | Admitting: Internal Medicine

## 2018-10-05 DIAGNOSIS — R921 Mammographic calcification found on diagnostic imaging of breast: Secondary | ICD-10-CM | POA: Diagnosis not present

## 2018-10-12 ENCOUNTER — Encounter (HOSPITAL_COMMUNITY): Payer: Medicare PPO

## 2018-10-21 ENCOUNTER — Other Ambulatory Visit: Payer: Self-pay | Admitting: Internal Medicine

## 2018-10-21 DIAGNOSIS — R921 Mammographic calcification found on diagnostic imaging of breast: Secondary | ICD-10-CM

## 2018-10-28 ENCOUNTER — Ambulatory Visit
Admission: RE | Admit: 2018-10-28 | Discharge: 2018-10-28 | Disposition: A | Discharge status: Home / Self Care | Payer: Medicare PPO | Source: Ambulatory Visit | Attending: Internal Medicine | Admitting: Internal Medicine

## 2018-10-28 DIAGNOSIS — R921 Mammographic calcification found on diagnostic imaging of breast: Secondary | ICD-10-CM

## 2018-11-16 ENCOUNTER — Ambulatory Visit: Payer: Self-pay | Admitting: General Surgery

## 2018-11-25 ENCOUNTER — Encounter (HOSPITAL_COMMUNITY)
Admission: RE | Admit: 2018-11-25 | Discharge: 2018-11-25 | Disposition: A | Discharge status: Home / Self Care | Payer: Medicare PPO | Source: Ambulatory Visit | Attending: General Surgery | Admitting: General Surgery

## 2018-11-25 ENCOUNTER — Encounter (HOSPITAL_COMMUNITY): Payer: Self-pay

## 2018-11-25 DIAGNOSIS — Z01812 Encounter for preprocedural laboratory examination: Secondary | ICD-10-CM | POA: Insufficient documentation

## 2018-11-25 HISTORY — DX: Major depressive disorder, single episode, unspecified: F32.9

## 2018-11-25 HISTORY — DX: Depression, unspecified: F32.A

## 2018-11-25 HISTORY — DX: Anxiety disorder, unspecified: F41.9

## 2018-11-25 LAB — BASIC METABOLIC PANEL
ANION GAP: 8 (ref 5–15)
BUN: 15 mg/dL (ref 8–23)
CALCIUM: 9.4 mg/dL (ref 8.9–10.3)
CO2: 27 mmol/L (ref 22–32)
Chloride: 106 mmol/L (ref 98–111)
Creatinine, Ser: 0.89 mg/dL (ref 0.44–1.00)
GFR calc Af Amer: >60 mL/min (ref >60)
GLUCOSE: 133 mg/dL — AB (ref 70–99)
Potassium: 3.8 mmol/L (ref 3.5–5.1)
SODIUM: 141 mmol/L (ref 135–145)

## 2018-11-25 LAB — CBC
HCT: 41.5 % (ref 36.0–46.0)
Hemoglobin: 13.4 g/dL (ref 12.0–15.0)
MCH: 31.2 pg (ref 26.0–34.0)
MCHC: 32.3 g/dL (ref 30.0–36.0)
MCV: 96.5 fL (ref 80.0–100.0)
Platelets: 274 10*3/uL (ref 150–400)
RBC: 4.3 MIL/uL (ref 3.87–5.11)
RDW: 12.5 % (ref 11.5–15.5)
WBC: 7.2 10*3/uL (ref 4.0–10.5)
nRBC: 0 % (ref 0.0–0.2)

## 2018-11-25 NOTE — Pre-Procedure Instructions (Addendum)
MARZELL ALLEMAND  11/25/2018      [facility], [place] - [id number] [place] [address] Phone: [phone] Fax: [phone]  [place], [place] [place] [address] Phone: [phone] Fax: [phone]    Your procedure is scheduled on 11-30-2018 Friday.   Report to [facility] Admitting at 5:30 A.M.   Call this number if you have problems the morning of surgery:  [phone]   Remember:  Do not eat solid food after midnight.  You may drink clear liquids until 4:30AM .  Clear liquids allowed are:                    Water, Juice (non-citric and without pulp), Carbonated beverages, Clear Tea, Black Coffee only and Gatorade    Take these medicines the morning of surgery with A SIP OF WATER   Symbicort inhaler if needed Ezetimibe(Zetia) Galantamine(Razadyne) (Crestor)Rosuvastatin   Follow your doctors instructions regarding your Aspirin. If no instructions were given by your doctor ,then you will need to call the prescribing office to get instructions.  STOP TAKING ANY ASPIRIN (UNLESS OTHERWISE INSTRUCTED BY YOUR SURGEON),ANTIINFLAMATORIES (IBUPROFEN,ALEVE,MOTRIN,ADVIL,GOODY'S POWDERS),HERBAL SUPPLEMENTS,FISH OIL,AND VITAMINS 5-7 DAYS PRIOR TO SURGERY     Do not wear jewelry, make-up or nail polish.  Do not wear lotions, powders, or perfumes, or deodorant.  Do not shave 48 hours prior to surgery. .  Do not bring valuables to the hospital.  [facility] is not responsible for any belongings or valuables.  Contacts, dentures or bridgework may not be worn into surgery.  Leave your suitcase in the car.  After surgery it may be brought to your room.  For patients admitted to the hospital, discharge time will be determined by your treatment team.  Patients discharged the day of surgery will not be allowed to drive home.   [place] - Preparing for  Surgery  Before surgery, you can play an important role.  Because skin is not sterile, your skin needs to be as free of germs as possible.  You can reduce the number of germs on you skin by washing with CHG (chlorahexidine gluconate) soap before surgery.  CHG is an antiseptic cleaner which kills germs and bonds with the skin to continue killing germs even after washing.  Oral Hygiene is also important in reducing the risk of infection.  Remember to brush your teeth with your regular toothpaste the morning of surgery.  Please DO NOT use if you have an allergy to CHG or antibacterial soaps.  If your skin becomes reddened/irritated stop using the CHG and inform your nurse when you arrive at Short Stay.  Do not shave (including legs and underarms) for at least 48 hours prior to the first CHG shower.  You may shave your face.  Please follow these instructions carefully:   1.  Shower with CHG Soap the night before surgery and the morning of Surgery.  2.  If you choose to wash your hair, wash your hair first as usual with your normal shampoo.  3.  After you shampoo, rinse your hair and body thoroughly to remove the shampoo. 4.  Use CHG as you would any other liquid soap.  You can apply chg directly to the skin and wash gently with a      scrungie or washcloth.  5.  Apply the CHG Soap to your body ONLY FROM THE NECK DOWN.   Do not use on open wounds or open sores. Avoid contact with your eyes, ears, mouth and genitals (private parts).  Wash genitals (private parts) with your normal soap.  6.  Wash thoroughly, paying special attention to the area where your surgery will be performed.  7.  Thoroughly rinse your body with warm water from the neck down.  8.  DO NOT shower/wash with your normal soap after using and rinsing off the CHG Soap.  9.  Pat yourself dry with a clean towel.            10.  Wear clean pajamas.            11.  Place clean sheets on your bed the night of your first shower and  do not sleep with pets.  Day of Surgery  Do not apply any lotions/deoderants the morning of surgery.   Please wear clean clothes to the hospital/surgery center. Remember to brush your teeth with toothpaste.    Please read over the following fact sheets that you were given. Pain Booklet, Coughing and Deep Breathing and Surgical Site Infection Prevention

## 2018-11-25 NOTE — Progress Notes (Addendum)
PCP   Dr. Crist Infante-  Cardiologist - Pt. Was seen by Dr. Cathie Olden in 2014 due to chest pain,Normal stress test and ECHO was told she only needed to come back on a as needed basis, has not seen since.  Chest x-ray - n/a EKG - 2013 Stress Test -11-09-2012  ECHO - 06-19-2016 Cardiac Cath - n/a  Sleep Study - n/a CPAP - n/a  Fasting Blood Sugar - n/a Checks Blood Sugar n/a_____ times a day  Blood Thinner Instructions:n/a Aspirin Instructions:n/a  Anesthesia review: no  Patient denies shortness of breath, fever, cough and chest pain at PAT appointment   Patient verbalized understanding of instructions that were given to them at the PAT appointment. Patient was also instructed that they will need to review over the PAT instructions again at home before surgery.

## 2018-11-29 NOTE — H&P (Signed)
History of Present Illness Diamond Bowers T. Stephane Junkins Bowers; 11/16/2018 3:16 PM) The patient is a 78 year old female who presents with breast cancer. She is a post menopausal female referred by Dr. Margarette Canada for evaluation of recently diagnosed carcinoma of the left breast. Patient has a history of remote right mastectomy and reconstruction at [facility] in the 1990s. She received postoperative hormonal treatment. She has a family history of breast cancer in her grandmother. She recently presented for a screening mamogram revealing increased calcifications in the left breast. Subsequent imaging included diagnostic mamogram showing extensive heterogeneous calcifications throughout the lateral left breast spanning at least 4-1/2 cm. No mass or architectural distortion. 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 She is seen now in the office for initial treatment planning. She has experienced no breast symptoms, specifically lump or pain or nipple discharge or skin changes. She is status post reduction mammoplasty on the left at the time of her right reconstruction at [facility].  Findings at that time were the following: Tumor size: At least 4.5 cm Tumor grade: Intermediate to high Estrogen Receptor: +90% Progesterone Receptor: Negative     Past Surgical History Diamond Bowers, [place]; 11/16/2018 2:33 PM) Breast Mass; Local Excision  Right. Breast Reconstruction  Right. Mastectomy  Right.  Diagnostic Studies History Diamond Bowers, [place]; 11/16/2018 2:33 PM) Colonoscopy  within last year Mammogram  within last year Pap Smear  1-5 years ago  Allergies Diamond Bowers, [place]; 11/16/2018 2:36 PM) No Known Drug Allergies [11/16/2018]: Allergies Reconciled   Medication History Diamond Bowers, CMA; 11/16/2018 2:39 PM) Galantamine Hydrobromide ER (8MG   Capsule ER 24HR, Oral) Active. Symbicort (160-4.5MCG/ACT Aerosol, Inhalation) Active. raNITIdine HCl (150MG  Capsule, Oral) Active. Rosuvastatin Calcium (5MG  Tablet, Oral) Active. Zolpidem Tartrate (10MG  Tablet, Oral) Active. Ezetimibe (10MG  Tablet, Oral) Active. Calcium /Vitamin D (600-125MG -IU Tablet, Oral) Active. Medications Reconciled  Social History Diamond Bowers, [place]; 11/16/2018 2:33 PM) Caffeine use  Coffee. No alcohol use  No drug use  Tobacco use  Never smoker.  Family History Diamond Bowers, [place]; 11/16/2018 2:33 PM) Cerebrovascular Accident  Mother. Hypertension  Mother. Thyroid problems  Father.  Pregnancy / Birth History Diamond Bowers, [place]; 11/16/2018 2:33 PM) Age at menarche  76 years. Age of menopause  72-60 Gravida  2 Maternal age  13-20 Para  2  Other Problems Diamond Bowers, [place]; 11/16/2018 2:33 PM) Asthma  Back Pain  Breast Cancer  Cancer  Hypercholesterolemia  Lump In Breast   Vitals Diamond Bowers CMA; 11/16/2018 2:35 PM) 11/16/2018 2:35 PM Weight: 137.2 lb Height: 64in Body Surface Area: 1.67 m Body Mass Index: 23.55 kg/m  Temp.: 97.6F  Pulse: 104 (Regular)  BP: 188/78 (Sitting, Left Arm, Standard)       Physical Exam Diamond Bowers T. Diamond Bowers; 11/16/2018 3:15 PM) The physical exam findings are as follows: Note:General: Alert, well-developed and well nourished older Caucasian female, in no distress Skin: Warm and dry without rash or infection. HEENT: No palpable masses or thyromegaly. Sclera nonicteric. Pupils equal round and reactive. Breasts: Healed reconstructed chest wall on the right negative for masses or skin changes. Left breast status post reduction mammoplasty. Some thickening at a biopsy site but no discrete mass. No skin changes or nipple discharge or crusting. No palpable axillary adenopathy. Lymph nodes: No cervical, supraclavicular, or inguinal nodes palpable. Lungs: Breath sounds clear and  equal. No wheezing or increased work  of breathing. Cardiovascular: Regular rate and rhythm without murmer. Abdomen: Nondistended. Soft and nontender. No masses palpable. No organomegaly. No palpable hernias. Extremities: No edema or joint swelling or deformity. No chronic venous stasis changes. Neurologic: Alert and fully oriented. Gait normal. No focal weakness. Psychiatric: Normal mood and affect. Thought content appropriate with normal judgement and insight    Assessment & Plan Diamond Bowers T. Laraina Sulton Bowers; 11/16/2018 3:20 PM) DUCTAL CARCINOMA IN SITU (DCIS) OF LEFT BREAST (D05.12) Impression: 78 year old female with a new diagnosis of DCIS of the left breast, calcifications extensively through the lateral breast with 2 widely separated areas both positive on biopsy. Clinical stage 0, ER positive, PR negative. I discussed with the patient and her husband and son present today initial surgical treatment options. We discussed options of breast conservation with lumpectomy or total mastectomy. Due to the extensive nature of the process throughout the lateral left breast I do not feel that breast conservation is reasonable. We reviewed the films together and they understand the issue. She is agreeable to total mastectomy. We discussed reconstruction which she has declined at this time. We discussed the indications and nature of the procedure, and expected recovery, in detail. Surgical risks including anesthetic complications, cardiorespiratory complications, bleeding, infection, wound healing complications, blood clots, lymphedema, local and distant recurrence and possible need for further surgery based on the final pathology was discussed and understood. Chemotherapy, hormonal therapy and radiation therapy have been discussed. All questions were answered. They understand and agree to proceed and we will go ahead with scheduling. Likely referral to genetics. Current Plans Left total mastectomy under general  anesthesia as an outpatient

## 2018-11-30 ENCOUNTER — Ambulatory Visit (HOSPITAL_COMMUNITY): Payer: Medicare PPO | Admitting: Anesthesiology

## 2018-11-30 ENCOUNTER — Other Ambulatory Visit: Payer: Self-pay

## 2018-11-30 ENCOUNTER — Encounter (HOSPITAL_COMMUNITY)
Admission: RE | Disposition: A | Discharge status: Home / Self Care | Payer: Self-pay | Source: Home / Self Care | Attending: General Surgery

## 2018-11-30 ENCOUNTER — Encounter (HOSPITAL_COMMUNITY): Payer: Self-pay

## 2018-11-30 ENCOUNTER — Ambulatory Visit (HOSPITAL_COMMUNITY)
Admission: RE | Admit: 2018-11-30 | Discharge: 2018-12-01 | Disposition: A | Discharge status: Home / Self Care | Payer: Medicare PPO | Attending: General Surgery | Admitting: General Surgery

## 2018-11-30 DIAGNOSIS — J45909 Unspecified asthma, uncomplicated: Secondary | ICD-10-CM | POA: Diagnosis not present

## 2018-11-30 DIAGNOSIS — Z888 Allergy status to other drugs, medicaments and biological substances status: Secondary | ICD-10-CM | POA: Insufficient documentation

## 2018-11-30 DIAGNOSIS — M797 Fibromyalgia: Secondary | ICD-10-CM | POA: Insufficient documentation

## 2018-11-30 DIAGNOSIS — Z9011 Acquired absence of right breast and nipple: Secondary | ICD-10-CM | POA: Diagnosis not present

## 2018-11-30 DIAGNOSIS — E7800 Pure hypercholesterolemia, unspecified: Secondary | ICD-10-CM | POA: Insufficient documentation

## 2018-11-30 DIAGNOSIS — Z79899 Other long term (current) drug therapy: Secondary | ICD-10-CM | POA: Diagnosis not present

## 2018-11-30 DIAGNOSIS — D0512 Intraductal carcinoma in situ of left breast: Secondary | ICD-10-CM | POA: Insufficient documentation

## 2018-11-30 DIAGNOSIS — L821 Other seborrheic keratosis: Secondary | ICD-10-CM | POA: Insufficient documentation

## 2018-11-30 DIAGNOSIS — Z803 Family history of malignant neoplasm of breast: Secondary | ICD-10-CM | POA: Diagnosis not present

## 2018-11-30 DIAGNOSIS — Z7951 Long term (current) use of inhaled steroids: Secondary | ICD-10-CM | POA: Diagnosis not present

## 2018-11-30 DIAGNOSIS — Z170 Estrogen receptor positive status [ER+]: Secondary | ICD-10-CM | POA: Diagnosis not present

## 2018-11-30 HISTORY — PX: TOTAL MASTECTOMY: SHX6129

## 2018-11-30 SURGERY — MASTECTOMY, SIMPLE
Anesthesia: General | Site: Breast | Laterality: Left

## 2018-11-30 MED ORDER — OXYCODONE HCL 5 MG PO TABS
ORAL_TABLET | ORAL | Status: AC
  Filled 2018-11-30: qty 2

## 2018-11-30 MED ORDER — GABAPENTIN 300 MG PO CAPS
300.0000 mg | ORAL_CAPSULE | ORAL | Status: AC
  Administered 2018-11-30: 300 mg via ORAL
  Filled 2018-11-30: qty 1

## 2018-11-30 MED ORDER — ONDANSETRON HCL 4 MG/2ML IJ SOLN
INTRAMUSCULAR | Status: AC
  Filled 2018-11-30: qty 2

## 2018-11-30 MED ORDER — GABAPENTIN 300 MG PO CAPS
300.0000 mg | ORAL_CAPSULE | Freq: Two times a day (BID) | ORAL | Status: DC
  Administered 2018-11-30 (×2): 300 mg via ORAL
  Filled 2018-11-30 (×2): qty 1

## 2018-11-30 MED ORDER — ACETAMINOPHEN 500 MG PO TABS
1000.0000 mg | ORAL_TABLET | ORAL | Status: AC
  Administered 2018-11-30: 1000 mg via ORAL
  Filled 2018-11-30: qty 2

## 2018-11-30 MED ORDER — ONDANSETRON HCL 4 MG/2ML IJ SOLN
INTRAMUSCULAR | Status: DC | PRN
  Administered 2018-11-30: 4 mg via INTRAVENOUS

## 2018-11-30 MED ORDER — CEFAZOLIN SODIUM-DEXTROSE 2-4 GM/100ML-% IV SOLN
2.0000 g | INTRAVENOUS | Status: AC
  Administered 2018-11-30: 2 g via INTRAVENOUS
  Filled 2018-11-30: qty 100

## 2018-11-30 MED ORDER — POTASSIUM CHLORIDE IN NACL 20-0.9 MEQ/L-% IV SOLN
INTRAVENOUS | Status: DC
  Administered 2018-11-30: 11:00:00 via INTRAVENOUS
  Filled 2018-11-30 (×2): qty 1000

## 2018-11-30 MED ORDER — LIDOCAINE 2% (20 MG/ML) 5 ML SYRINGE
INTRAMUSCULAR | Status: AC
  Filled 2018-11-30: qty 5

## 2018-11-30 MED ORDER — MORPHINE SULFATE (PF) 2 MG/ML IV SOLN: 2.0000 mg | INTRAVENOUS | Status: DC | PRN

## 2018-11-30 MED ORDER — LIDOCAINE 2% (20 MG/ML) 5 ML SYRINGE
INTRAMUSCULAR | Status: DC | PRN
  Administered 2018-11-30: 60 mg via INTRAVENOUS

## 2018-11-30 MED ORDER — ONDANSETRON 4 MG PO TBDP: 4.0000 mg | ORAL_TABLET | Freq: Four times a day (QID) | ORAL | Status: DC | PRN

## 2018-11-30 MED ORDER — DEXAMETHASONE SODIUM PHOSPHATE 10 MG/ML IJ SOLN
INTRAMUSCULAR | Status: DC | PRN
  Administered 2018-11-30: 10 mg via INTRAVENOUS

## 2018-11-30 MED ORDER — CHLORHEXIDINE GLUCONATE CLOTH 2 % EX PADS: 6.0000 | MEDICATED_PAD | Freq: Once | CUTANEOUS | Status: DC

## 2018-11-30 MED ORDER — CELECOXIB 200 MG PO CAPS
200.0000 mg | ORAL_CAPSULE | ORAL | Status: AC
  Administered 2018-11-30: 200 mg via ORAL
  Filled 2018-11-30: qty 1

## 2018-11-30 MED ORDER — MOMETASONE FURO-FORMOTEROL FUM 200-5 MCG/ACT IN AERO: 2.0000 | INHALATION_SPRAY | Freq: Two times a day (BID) | RESPIRATORY_TRACT | Status: DC | PRN

## 2018-11-30 MED ORDER — PHENYLEPHRINE 40 MCG/ML (10ML) SYRINGE FOR IV PUSH (FOR BLOOD PRESSURE SUPPORT)
PREFILLED_SYRINGE | INTRAVENOUS | Status: DC | PRN
  Administered 2018-11-30 (×2): 80 ug via INTRAVENOUS

## 2018-11-30 MED ORDER — FENTANYL CITRATE (PF) 100 MCG/2ML IJ SOLN
INTRAMUSCULAR | Status: AC
  Filled 2018-11-30: qty 2

## 2018-11-30 MED ORDER — PROPOFOL 10 MG/ML IV BOLUS
INTRAVENOUS | Status: AC
  Filled 2018-11-30: qty 40

## 2018-11-30 MED ORDER — MONTELUKAST SODIUM 10 MG PO TABS: 10.0000 mg | ORAL_TABLET | Freq: Every day | ORAL | Status: DC

## 2018-11-30 MED ORDER — EZETIMIBE 10 MG PO TABS
10.0000 mg | ORAL_TABLET | Freq: Every day | ORAL | Status: DC
  Filled 2018-11-30: qty 1

## 2018-11-30 MED ORDER — GUAIFENESIN ER 600 MG PO TB12
600.0000 mg | ORAL_TABLET | Freq: Two times a day (BID) | ORAL | Status: DC
  Administered 2018-11-30: 600 mg via ORAL
  Filled 2018-11-30: qty 1

## 2018-11-30 MED ORDER — DEXAMETHASONE SODIUM PHOSPHATE 10 MG/ML IJ SOLN
INTRAMUSCULAR | Status: AC
  Filled 2018-11-30: qty 1

## 2018-11-30 MED ORDER — FENTANYL CITRATE (PF) 250 MCG/5ML IJ SOLN
INTRAMUSCULAR | Status: DC | PRN
  Administered 2018-11-30 (×4): 25 ug via INTRAVENOUS

## 2018-11-30 MED ORDER — ACETAMINOPHEN 500 MG PO TABS
1000.0000 mg | ORAL_TABLET | Freq: Four times a day (QID) | ORAL | Status: DC
  Administered 2018-11-30 – 2018-12-01 (×3): 1000 mg via ORAL
  Filled 2018-11-30 (×2): qty 2

## 2018-11-30 MED ORDER — ENOXAPARIN SODIUM 40 MG/0.4ML ~~LOC~~ SOLN: 40.0000 mg | SUBCUTANEOUS | Status: DC

## 2018-11-30 MED ORDER — GALANTAMINE HYDROBROMIDE 4 MG PO TABS
8.0000 mg | ORAL_TABLET | Freq: Two times a day (BID) | ORAL | Status: DC
  Administered 2018-11-30: 8 mg via ORAL
  Filled 2018-11-30 (×2): qty 2

## 2018-11-30 MED ORDER — LACTATED RINGERS IV SOLN
INTRAVENOUS | Status: DC | PRN
  Administered 2018-11-30: 07:00:00 via INTRAVENOUS

## 2018-11-30 MED ORDER — FENTANYL CITRATE (PF) 100 MCG/2ML IJ SOLN
25.0000 ug | INTRAMUSCULAR | Status: DC | PRN
  Administered 2018-11-30 (×2): 50 ug via INTRAVENOUS

## 2018-11-30 MED ORDER — SODIUM CHLORIDE 0.9 % IV SOLN
INTRAVENOUS | Status: DC | PRN
  Administered 2018-11-30: 40 ug/min via INTRAVENOUS

## 2018-11-30 MED ORDER — 0.9 % SODIUM CHLORIDE (POUR BTL) OPTIME
TOPICAL | Status: DC | PRN
  Administered 2018-11-30: 1000 mL

## 2018-11-30 MED ORDER — CELECOXIB 200 MG PO CAPS
200.0000 mg | ORAL_CAPSULE | Freq: Two times a day (BID) | ORAL | Status: DC
  Administered 2018-11-30 (×2): 200 mg via ORAL
  Filled 2018-11-30 (×2): qty 1

## 2018-11-30 MED ORDER — PROPOFOL 10 MG/ML IV BOLUS
INTRAVENOUS | Status: DC | PRN
  Administered 2018-11-30: 200 mg via INTRAVENOUS

## 2018-11-30 MED ORDER — OXYCODONE HCL 5 MG PO TABS
5.0000 mg | ORAL_TABLET | ORAL | Status: DC | PRN
  Administered 2018-11-30: 10 mg via ORAL

## 2018-11-30 MED ORDER — STERILE WATER FOR IRRIGATION IR SOLN
Status: DC | PRN
  Administered 2018-11-30: 1000 mL

## 2018-11-30 MED ORDER — MIDAZOLAM HCL 5 MG/5ML IJ SOLN
INTRAMUSCULAR | Status: DC | PRN
  Administered 2018-11-30: 2 mg via INTRAVENOUS

## 2018-11-30 MED ORDER — MIDAZOLAM HCL 2 MG/2ML IJ SOLN
INTRAMUSCULAR | Status: AC
  Filled 2018-11-30: qty 2

## 2018-11-30 MED ORDER — FENTANYL CITRATE (PF) 250 MCG/5ML IJ SOLN
INTRAMUSCULAR | Status: AC
  Filled 2018-11-30: qty 5

## 2018-11-30 MED ORDER — BUPIVACAINE-EPINEPHRINE (PF) 0.5% -1:200000 IJ SOLN
INTRAMUSCULAR | Status: DC | PRN
  Administered 2018-11-30: 30 mL

## 2018-11-30 MED ORDER — ONDANSETRON HCL 4 MG/2ML IJ SOLN
4.0000 mg | Freq: Four times a day (QID) | INTRAMUSCULAR | Status: DC | PRN
  Administered 2018-11-30: 4 mg via INTRAVENOUS
  Filled 2018-11-30: qty 2

## 2018-11-30 SURGICAL SUPPLY — 47 items
BINDER BREAST LRG (GAUZE/BANDAGES/DRESSINGS) ×3 IMPLANT
BINDER BREAST XLRG (GAUZE/BANDAGES/DRESSINGS) IMPLANT
BIOPATCH RED 1 DISK 7.0 (GAUZE/BANDAGES/DRESSINGS) ×2 IMPLANT
BIOPATCH RED 1IN DISK 7.0MM (GAUZE/BANDAGES/DRESSINGS) ×1
CANISTER SUCT 3000ML PPV (MISCELLANEOUS) ×6 IMPLANT
CHLORAPREP W/TINT 26ML (MISCELLANEOUS) ×3 IMPLANT
CLIP VESOCCLUDE MED 6/CT (CLIP) ×3 IMPLANT
COVER SURGICAL LIGHT HANDLE (MISCELLANEOUS) ×3 IMPLANT
COVER WAND RF STERILE (DRAPES) IMPLANT
DERMABOND ADVANCED (GAUZE/BANDAGES/DRESSINGS) ×2
DERMABOND ADVANCED .7 DNX12 (GAUZE/BANDAGES/DRESSINGS) ×1 IMPLANT
DEVICE DISSECT PLASMABLAD 3.0S (MISCELLANEOUS) ×1 IMPLANT
DRAIN CHANNEL 19F RND (DRAIN) ×3 IMPLANT
DRAPE CHEST BREAST 15X10 FENES (DRAPES) ×3 IMPLANT
DRAPE INCISE IOBAN 66X45 STRL (DRAPES) ×3 IMPLANT
DRAPE SURG 17X23 STRL (DRAPES) ×12 IMPLANT
DRAPE UTILITY XL STRL (DRAPES) IMPLANT
ELECT CAUTERY BLADE 6.4 (BLADE) ×3 IMPLANT
ELECT REM PT RETURN 9FT ADLT (ELECTROSURGICAL) ×3
ELECTRODE REM PT RTRN 9FT ADLT (ELECTROSURGICAL) ×1 IMPLANT
EVACUATOR SILICONE 100CC (DRAIN) ×3 IMPLANT
GLOVE BIO SURGEON STRL SZ7.5 (GLOVE) ×6 IMPLANT
GLOVE BIOGEL PI IND STRL 8 (GLOVE) ×1 IMPLANT
GLOVE BIOGEL PI INDICATOR 8 (GLOVE) ×2
GLOVE ECLIPSE 7.5 STRL STRAW (GLOVE) ×3 IMPLANT
GOWN STRL REUS W/ TWL LRG LVL3 (GOWN DISPOSABLE) ×1 IMPLANT
GOWN STRL REUS W/ TWL XL LVL3 (GOWN DISPOSABLE) ×2 IMPLANT
GOWN STRL REUS W/TWL LRG LVL3 (GOWN DISPOSABLE) ×2
GOWN STRL REUS W/TWL XL LVL3 (GOWN DISPOSABLE) ×4
KIT BASIN OR (CUSTOM PROCEDURE TRAY) ×3 IMPLANT
KIT TURNOVER KIT B (KITS) ×3 IMPLANT
NS IRRIG 1000ML POUR BTL (IV SOLUTION) ×3 IMPLANT
PACK GENERAL/GYN (CUSTOM PROCEDURE TRAY) ×3 IMPLANT
PAD ABD 8X10 STRL (GAUZE/BANDAGES/DRESSINGS) ×3 IMPLANT
PAD ARMBOARD 7.5X6 YLW CONV (MISCELLANEOUS) ×6 IMPLANT
PENCIL SMOKE EVACUATOR (MISCELLANEOUS) IMPLANT
PLASMABLADE 3.0S (MISCELLANEOUS) ×3
SPECIMEN JAR X LARGE (MISCELLANEOUS) IMPLANT
STAPLER VISISTAT 35W (STAPLE) ×3 IMPLANT
SUT ETHILON 2 0 FS 18 (SUTURE) ×3 IMPLANT
SUT MON AB 5-0 PS2 18 (SUTURE) ×6 IMPLANT
SUT VIC AB 3-0 SH 18 (SUTURE) ×3 IMPLANT
SUT VICRYL AB 3 0 TIES (SUTURE) ×3 IMPLANT
TOWEL OR 17X24 6PK STRL BLUE (TOWEL DISPOSABLE) ×3 IMPLANT
TOWEL OR 17X26 10 PK STRL BLUE (TOWEL DISPOSABLE) IMPLANT
TUBE CONNECTING 12'X1/4 (SUCTIONS) ×1
TUBE CONNECTING 12X1/4 (SUCTIONS) ×2 IMPLANT

## 2018-11-30 NOTE — Transfer of Care (Signed)
Immediate Anesthesia Transfer of Care Note  Patient: Diamond Bowers  Procedure(s) Performed: LEFT TOTAL MASTECTOMY (Left Breast)  Patient Location: PACU  Anesthesia Type:GA combined with regional for post-op pain  Level of Consciousness: drowsy  Airway & Oxygen Therapy: Patient Spontanous Breathing and Patient connected to nasal cannula oxygen  Post-op Assessment: Report given to RN and Post -op Vital signs reviewed and stable  Post vital signs: Reviewed and stable  Last Vitals:  Vitals Value Taken Time  BP 162/64 11/30/2018  9:01 AM  Temp    Pulse 67 11/30/2018  9:05 AM  Resp 14 11/30/2018  9:05 AM  SpO2 99 % 11/30/2018  9:05 AM  Vitals shown include unvalidated device data.  Last Pain:  Vitals:   11/30/18 0602  PainSc: 0-No pain         Complications: No apparent anesthesia complications

## 2018-11-30 NOTE — Anesthesia Procedure Notes (Signed)
Procedure Name: LMA Insertion Date/Time: 11/30/2018 7:44 AM Performed by: Valda Favia, CRNA Pre-anesthesia Checklist: Patient identified, Emergency Drugs available, Suction available and Patient being monitored Patient Re-evaluated:Patient Re-evaluated prior to induction Oxygen Delivery Method: Circle System Utilized Preoxygenation: Pre-oxygenation with 100% oxygen Induction Type: IV induction Ventilation: Mask ventilation without difficulty LMA: LMA inserted LMA Size: 4.0 Number of attempts: 1 Airway Equipment and Method: Bite block Placement Confirmation: positive ETCO2 Tube secured with: Tape Dental Injury: Teeth and Oropharynx as per pre-operative assessment

## 2018-11-30 NOTE — Op Note (Signed)
Preoperative Diagnosis: DCIS LEFT BREAST  Postoprative Diagnosis: DCIS LEFT BREAST  Procedure: Procedure(s): LEFT TOTAL MASTECTOMY   Surgeon: Excell Seltzer T   Assistants: RNFA  Anesthesia:  General LMA anesthesia  Indications: 78 year old female with a new diagnosis of DCIS of the left breast, calcifications extensively through the lateral breast with 2 widely separated areas both positive on biopsy. Clinical stage 0, ER positive, PR negative.  Remote history of right mastectomy.  With the above findings after discussion detailed elsewhere we have elected to proceed with left total mastectomy.    Procedure Detail: Patient was brought to the operating room, placed in the supine position on the operating table, and laryngeal mask general anesthesia induced.  Her left arm was carefully positioned extended on an armboard.  The entire left breast and chest, axilla and upper arm were widely sterilely prepped and draped.  She received preoperative IV antibiotics.  PAS were in place.  Patient timeout was performed and correct procedure verified.  Using the plasma blade a transverse elliptical incision encompassing the central breast skin and nipple areolar complex was used and deepened into the subcutaneous tissue.  Using the plasma blade skin and subcutaneous flaps were raised superiorly toward the clavicle, medially to the edge of the sternum, inferiorly to the inframammary crease and laterally out to the anterior border of the latissimus which was defined.  The dissection was deepened down to the chest wall.  The breast was reflected up off of the chest wall medial to lateral dissecting attachments to the pectoralis fascia and serratus.  The specimen was dissected free from the edge of the pectoralis major and up anteriorly off of the anterior border of latissimus working toward the axilla until the specimen was completely isolated down to the low axilla.  I came across this with Kelly clamps and  tied with 3-0 Vicryl ties and the specimen was removed.  Specimen was oriented and sent for permanent pathology.  The wound was irrigated and complete hemostasis obtained.  A 19 Blake closed suction drain was left through a separate stab wound under both flaps.  Subcutaneous tissue was closed with interrupted 3-0 Vicryl and skin with running subcuticular 5-0 Monocryl and Dermabond.  Sponge needle and instrument counts were correct.    Findings: As above  Estimated Blood Loss:  less than 50 mL         Drains: 19 round Blake  Blood Given: none          Specimens: Left total mastectomy        Complications:  * No complications entered in OR log *         Disposition: PACU - hemodynamically stable.         Condition: stable

## 2018-11-30 NOTE — Anesthesia Preprocedure Evaluation (Addendum)
Anesthesia Evaluation  Patient identified by MRN, date of birth, ID band Patient awake    Reviewed: Allergy & Precautions, NPO status , Patient's Chart, lab work & pertinent test results  Airway Mallampati: I  TM Distance: >3 FB Neck ROM: Full    Dental  (+) Teeth Intact, Dental Advisory Given   Pulmonary    Pulmonary exam normal        Cardiovascular  Rhythm:Regular Rate:Normal     Neuro/Psych    GI/Hepatic   Endo/Other    Renal/GU      Musculoskeletal   Abdominal Normal abdominal exam  (+)   Peds  Hematology   Anesthesia Other Findings   Reproductive/Obstetrics                            Anesthesia Physical Anesthesia Plan  ASA: II  Anesthesia Plan: General   Post-op Pain Management: GA combined w/ Regional for post-op pain   Induction: Intravenous  PONV Risk Score and Plan: 3 and Ondansetron, Treatment may vary due to age or medical condition and Dexamethasone  Airway Management Planned: LMA  Additional Equipment: None  Intra-op Plan:   Post-operative Plan: Extubation in OR  Informed Consent:   Plan Discussed with: CRNA  Anesthesia Plan Comments:         Anesthesia Quick Evaluation

## 2018-11-30 NOTE — Interval H&P Note (Signed)
History and Physical Interval Note:  11/30/2018 7:24 AM  Diamond Bowers  has presented today for surgery, with the diagnosis of DCIS LEFT BREAST  The various methods of treatment have been discussed with the patient and family. After consideration of risks, benefits and other options for treatment, the patient has consented to  Procedure(s): LEFT TOTAL MASTECTOMY (Left) as a surgical intervention .  The patient's history has been reviewed, patient examined, no change in status, stable for surgery.  I have reviewed the patient's chart and labs.  Questions were answered to the patient's satisfaction.     Darene Lamer [place] Warriner

## 2018-11-30 NOTE — Anesthesia Procedure Notes (Signed)
Anesthesia Regional Block: Pectoralis block   Pre-Anesthetic Checklist: ,, timeout performed, Correct Patient, Correct Site, Correct Laterality, Correct Procedure, Correct Position, site marked, Risks and benefits discussed,  Surgical consent,  Pre-op evaluation,  At surgeon's request and post-op pain management  Laterality: Left  Prep: chloraprep       Needles:  Injection technique: Single-shot  Needle Type: Echogenic Stimulator Needle     Needle Length: 9cm  Needle Gauge: 21     Additional Needles:   Procedures:,,,, ultrasound used (permanent image in chart),,,,  Narrative:  Start time: 11/30/2018 7:23 AM End time: 11/30/2018 7:28 AM Injection made incrementally with aspirations every 5 mL.  Performed by: Personally  Anesthesiologist: [name], MD  Additional Notes: Patient tolerated the procedure well. Local anesthetic introduced in an incremental fashion under minimal resistance after negative aspirations. No paresthesias were elicited. After completion of the procedure, no acute issues were identified and patient continued to be monitored by RN.

## 2018-11-30 NOTE — Anesthesia Postprocedure Evaluation (Signed)
Anesthesia Post Note  Patient: Diamond Bowers  Procedure(s) Performed: LEFT TOTAL MASTECTOMY (Left Breast)     Patient location during evaluation: PACU Anesthesia Type: General Level of consciousness: awake and alert Pain management: pain level controlled Vital Signs Assessment: post-procedure vital signs reviewed and stable Respiratory status: spontaneous breathing, nonlabored ventilation, respiratory function stable and patient connected to nasal cannula oxygen Cardiovascular status: blood pressure returned to baseline and stable Postop Assessment: no apparent nausea or vomiting Anesthetic complications: no    Last Vitals:  Vitals:   11/30/18 1001 11/30/18 1015  BP: (!) 157/69 (!) 151/54  Pulse: 66 67  Resp: 15 (!) 7  Temp:    SpO2: 97% (!) 89%    Last Pain:  Vitals:   11/30/18 1055  PainSc: 2                  [name]

## 2018-12-01 ENCOUNTER — Encounter (HOSPITAL_COMMUNITY): Payer: Self-pay | Admitting: General Surgery

## 2018-12-01 DIAGNOSIS — D0512 Intraductal carcinoma in situ of left breast: Secondary | ICD-10-CM | POA: Diagnosis not present

## 2018-12-01 LAB — CBC
HEMATOCRIT: 33 % — AB (ref 36.0–46.0)
Hemoglobin: 10.7 g/dL — ABNORMAL LOW (ref 12.0–15.0)
MCH: 30.9 pg (ref 26.0–34.0)
MCHC: 32.4 g/dL (ref 30.0–36.0)
MCV: 95.4 fL (ref 80.0–100.0)
Platelets: 190 10*3/uL (ref 150–400)
RBC: 3.46 MIL/uL — ABNORMAL LOW (ref 3.87–5.11)
RDW: 12.6 % (ref 11.5–15.5)
WBC: 7.2 10*3/uL (ref 4.0–10.5)
nRBC: 0 % (ref 0.0–0.2)

## 2018-12-01 NOTE — Discharge Instructions (Signed)
[facility] °[phone] ° °MASTECTOMY: POST OP INSTRUCTIONS ° °Always review your discharge instruction sheet given to you by the facility where your surgery was performed. °IF YOU HAVE DISABILITY OR FAMILY LEAVE FORMS, YOU MUST BRING THEM TO THE OFFICE FOR PROCESSING.   °DO NOT GIVE THEM TO YOUR DOCTOR. °A prescription for pain medication may be given to you upon discharge.  Take your pain medication as prescribed, if needed.  If narcotic pain medicine is not needed, then you may take acetaminophen (Tylenol) or ibuprofen (Advil) as needed. °1. Take your usually prescribed medications unless otherwise directed. °2. If you need a refill on your pain medication, please contact your pharmacy.  They will contact our office to request authorization.  Prescriptions will not be filled after 5pm or on week-ends. °3. You should follow a light diet the first few days after arrival home, such as soup and crackers, etc.  Resume your normal diet the day after surgery. °4. Most patients will experience some swelling and bruising on the chest and underarm.  Ice packs will help.  Swelling and bruising can take several days to resolve.  °5. It is common to experience some constipation if taking pain medication after surgery.  Increasing fluid intake and taking a stool softener (such as Colace) will usually help or prevent this problem from occurring.  A mild laxative (Milk of Magnesia or Miralax) should be taken according to package instructions if there are no bowel movements after 48 hours. °6. Unless discharge instructions indicate otherwise, leave your bandage dry and in place until your next appointment in 3-5 days.  You may take a limited sponge bath.  No tube baths or showers until the drains are removed.  You may have steri-strips (small skin tapes) in place directly over the incision.  These strips should be left on the skin for 7-10 days.  If your surgeon used skin glue on the incision, you may  shower in 24 hours.  The glue will flake off over the next 2-3 weeks.  Any sutures or staples will be removed at the office during your follow-up visit. °7. DRAINS:  If you have drains in place, it is important to keep a list of the amount of drainage produced each day in your drains.  Before leaving the hospital, you should be instructed on drain care.  Call our office if you have any questions about your drains. °8. ACTIVITIES:  You may resume regular (light) daily activities beginning the next day--such as daily self-care, walking, climbing stairs--gradually increasing activities as tolerated.  You may have sexual intercourse when it is comfortable.  Refrain from any heavy lifting or straining until approved by your doctor. °a. You may drive when you are no longer taking prescription pain medication, you can comfortably wear a seatbelt, and you can safely maneuver your car and apply brakes. °b. RETURN TO WORK:  __________________________________________________________ °9. You should see your doctor in the office for a follow-up appointment approximately 3-5 days after your surgery.  Your doctor’s nurse will typically make your follow-up appointment when she calls you with your pathology report.  Expect your pathology report 2-3 business days after your surgery.  You may call to check if you do not hear from us after three days.   °10. OTHER INSTRUCTIONS: ______________________________________________________________________________________________ ____________________________________________________________________________________________ °WHEN TO CALL YOUR DOCTOR: °1. Fever over 101.0 °2. Nausea and/or vomiting °3. Extreme swelling or bruising °4. Continued bleeding from incision. °5. Increased pain, redness, or drainage from the incision. °  The clinic staff is available to answer your questions during regular business hours.  Please don’t hesitate to call and ask to speak to one of the nurses for clinical  concerns.  If you have a medical emergency, go to the nearest emergency room or call 911.  A surgeon from [facility] is always on call at the hospital. °[address], [address], [place], [place]  [address] ? [address] [address], [place], [place]   [address] °[phone] ? [phone] ? FAX [phone] °Web site: [email or web address] °

## 2018-12-01 NOTE — Discharge Summary (Signed)
  Patient ID: Diamond Bowers [id number] 78 y.o. 1941/01/19  11/30/2018  Discharge date and time: 12/01/2018   Admitting Physician: Edward Jolly  Discharge Physician: Edward Jolly  Admission Diagnoses: DCIS LEFT BREAST  Discharge Diagnoses: Same  Operations: Procedure(s): LEFT TOTAL MASTECTOMY  Admission Condition: good  Discharged Condition: good  Indication for Admission: 78 year old female with a new diagnosis of DCIS of the left breast, calcifications extensively through the lateral breast with 2 widely separated areas both positive on biopsy. Clinical stage 0, ER positive, PR negative.  Remote history of right mastectomy.  With the above findings after discussion detailed elsewhere we have elected to proceed with left total mastectomy.  Hospital Course: On the morning of admission the patient underwent an uneventful left total mastectomy.  Her postoperative course was uncomplicated.  The following morning she denies pain.  Vital signs are normal.  Skin flaps are healthy with no evidence of bleeding and JP drainage is serosanguineous.  She is felt ready for discharge.   Disposition: Home  Patient Instructions:  Allergies as of 12/01/2018      Reactions   Statins    " I am on them but they makes me hurt but I also have fibromyalgia and it is hard to determine what is causing my pain"       Medication List    TAKE these medications   budesonide-formoterol 160-4.5 MCG/ACT inhaler Commonly known as:  SYMBICORT Inhale 2 puffs into the lungs daily as needed (shortness of breath).   ezetimibe 10 MG tablet Commonly known as:  ZETIA Take 10 mg by mouth daily.   fish oil-omega-3 fatty acids 1000 MG capsule Take 1 g by mouth daily.   galantamine 8 MG tablet Commonly known as:  RAZADYNE Take 8 mg by mouth 2 (two) times daily.   guaiFENesin 600 MG 12 hr tablet Commonly known as:  MUCINEX Take 600 mg by mouth 2 (two) times daily.   montelukast 10 MG  tablet Commonly known as:  SINGULAIR Take 10 mg by mouth daily.   multivitamin capsule Take 1 capsule by mouth daily.   rosuvastatin 10 MG tablet Commonly known as:  CRESTOR Take 10 mg by mouth daily.   Vitamin D 50 MCG (2000 UT) tablet Take 2,000 Units by mouth daily.       Activity: activity as tolerated Diet: regular diet Wound Care: Instructed in JP drain care.  May shower in 48 hours.  Follow-up:  With Deborha Moseley in 2 weeks.  Signed: Edward Jolly MD, FACS  12/01/2018, 8:05 AM

## 2018-12-24 ENCOUNTER — Encounter: Payer: Self-pay | Admitting: Licensed Clinical Social Worker

## 2018-12-24 ENCOUNTER — Telehealth: Payer: Self-pay | Admitting: Licensed Clinical Social Worker

## 2018-12-24 NOTE — Telephone Encounter (Signed)
A genetic counseling appt has been scheduled for the pt to see Faith Rogue on 3/2 at 1pm. Pt aware of the appt date and time. Letter mailed.

## 2019-01-10 ENCOUNTER — Encounter: Payer: Medicare PPO | Admitting: Licensed Clinical Social Worker

## 2019-01-10 ENCOUNTER — Inpatient Hospital Stay: Payer: Medicare PPO | Attending: Genetic Counselor

## 2019-01-11 ENCOUNTER — Encounter: Payer: Self-pay | Admitting: Licensed Clinical Social Worker

## 2019-08-16 ENCOUNTER — Other Ambulatory Visit: Payer: Self-pay | Admitting: Orthopedic Surgery

## 2019-08-16 DIAGNOSIS — M545 Low back pain, unspecified: Secondary | ICD-10-CM

## 2019-08-17 ENCOUNTER — Telehealth: Payer: Self-pay

## 2019-08-17 NOTE — Telephone Encounter (Signed)
Spoke with patient's husband, since she has some dementia, to review her medications and drug allergies before being scheduled for a lumbar myelogram.  She has no medications to hold for this procedure.  Mr. Duchon was informed his wife will be here about two hours, will need a driver and will need to be on strict bedrest for 24 hours after the procedure.  I told him we would allow him to accompany the patient into the building for registration and to be prepped for the myelogram secondary to her dementia, but told him he would need to wait either in the lobby (if there were enough chairs) or in the car while she was having all of her imaging done.

## 2019-08-23 ENCOUNTER — Ambulatory Visit
Admission: RE | Admit: 2019-08-23 | Discharge: 2019-08-23 | Disposition: A | Discharge status: Home / Self Care | Payer: Medicare PPO | Source: Ambulatory Visit | Attending: Orthopedic Surgery | Admitting: Orthopedic Surgery

## 2019-08-23 ENCOUNTER — Other Ambulatory Visit: Payer: Self-pay

## 2019-08-23 DIAGNOSIS — M545 Low back pain, unspecified: Secondary | ICD-10-CM

## 2019-08-23 MED ORDER — IOPAMIDOL (ISOVUE-M 200) INJECTION 41%
15.0000 mL | Freq: Once | INTRAMUSCULAR | Status: AC
  Administered 2019-08-23: 15 mL via INTRATHECAL

## 2019-08-23 MED ORDER — DIAZEPAM 5 MG PO TABS
5.0000 mg | ORAL_TABLET | Freq: Once | ORAL | Status: AC
  Administered 2019-08-23: 5 mg via ORAL

## 2019-08-23 NOTE — Discharge Instructions (Signed)

## 2019-09-23 ENCOUNTER — Encounter (HOSPITAL_COMMUNITY): Admission: RE | Admit: 2019-09-23 | Payer: Medicare PPO | Source: Ambulatory Visit

## 2019-09-28 ENCOUNTER — Telehealth (HOSPITAL_COMMUNITY): Payer: Self-pay | Admitting: *Deleted

## 2019-09-28 NOTE — Telephone Encounter (Signed)
Verified with patient that she is no longer going to continue with Reclast and is taking another medication, at home in its place.

## 2020-01-13 IMAGING — XA DG MYELOGRAPHY LUMBAR INJ LUMBOSACRAL
13 of 17 series · 13 of 17 positions shown · non-contrast
Comparison: None.

CLINICAL DATA: Right-sided low back pain radiating into the right
lateral hip and down the outside of the leg to the foot.
TECHNIQUE: Contiguous axial images were obtained through the lumbar spine after
the intrathecal infusion of contrast. Coronal and sagittal
reconstructions were obtained of the axial image sets.

[Series 1: vasc standard · 1 of 1 slices shown (1 of 11)]
[im 1/1]
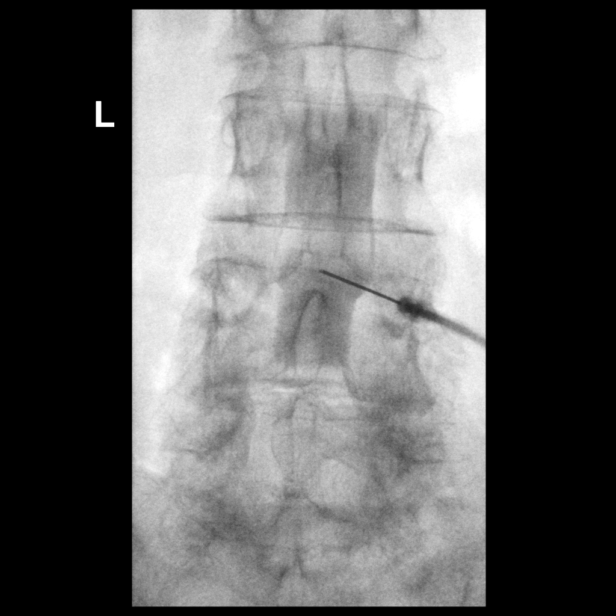

[Series 1: w lumbar spine lat · 0.15mm/px · 1 of 1 slices shown]
[im 1/1]
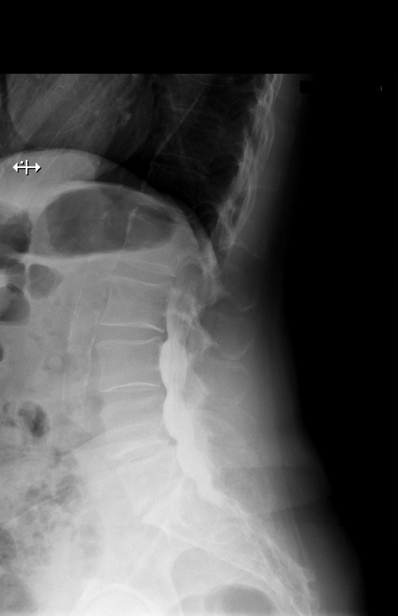

[Series 2: vasc standard · 1 of 1 slices shown (2 of 11)]
[im 1/1]
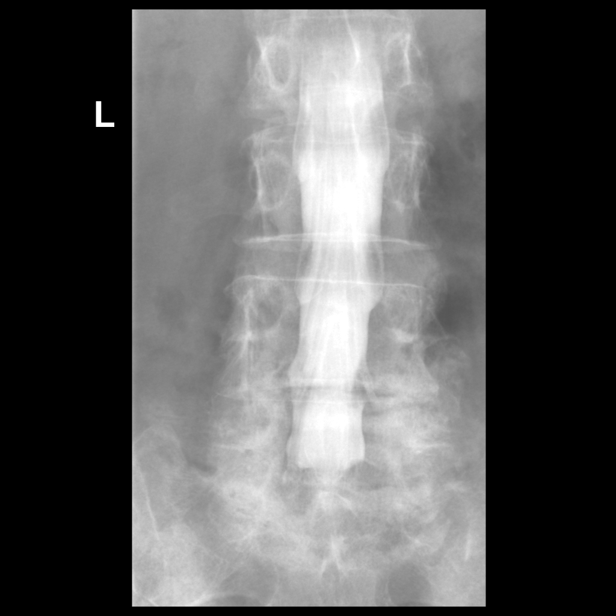

[Series 3: vasc standard · 1 of 1 slices shown (3 of 11)]
[im 1/1]
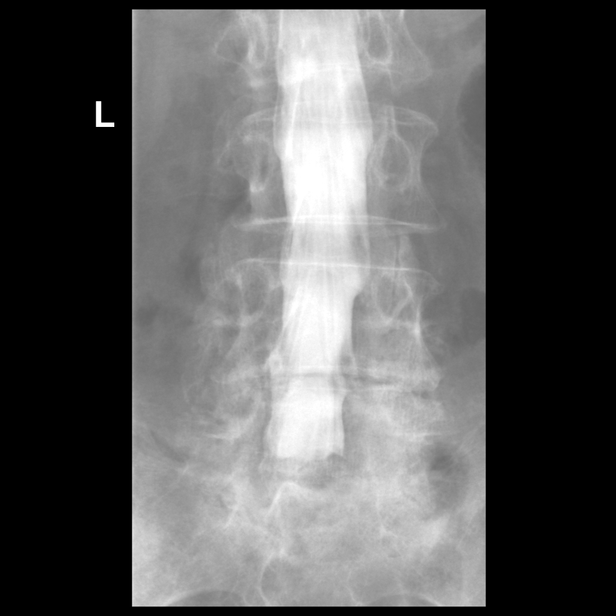

[Series 3: w lumbar spine extension · 0.15mm/px · 1 of 1 slices shown]
[im 1/1]
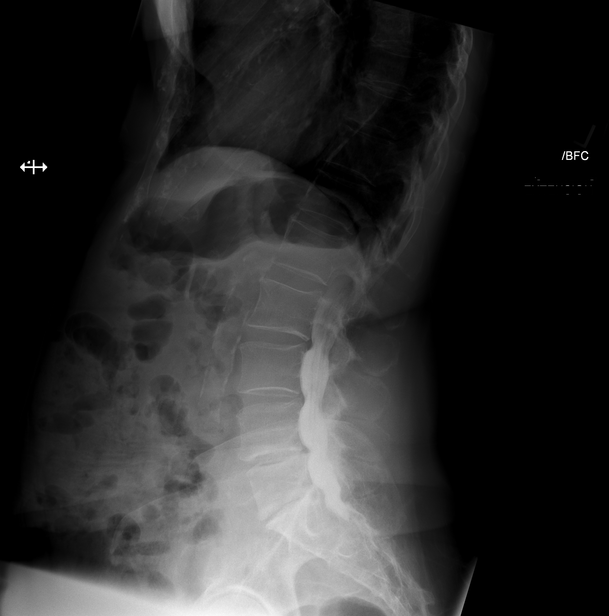

[Series 5: vasc standard · 1 of 1 slices shown (4 of 11)]
[im 1/1]
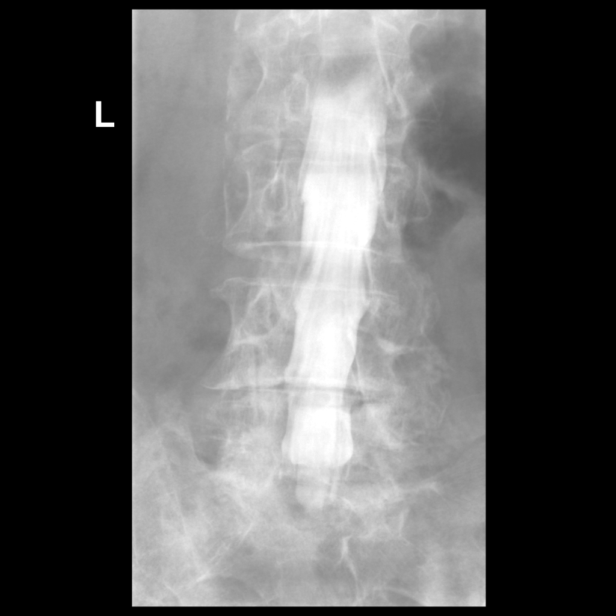

[Series 6: vasc standard · 1 of 1 slices shown (5 of 11)]
[im 1/1]
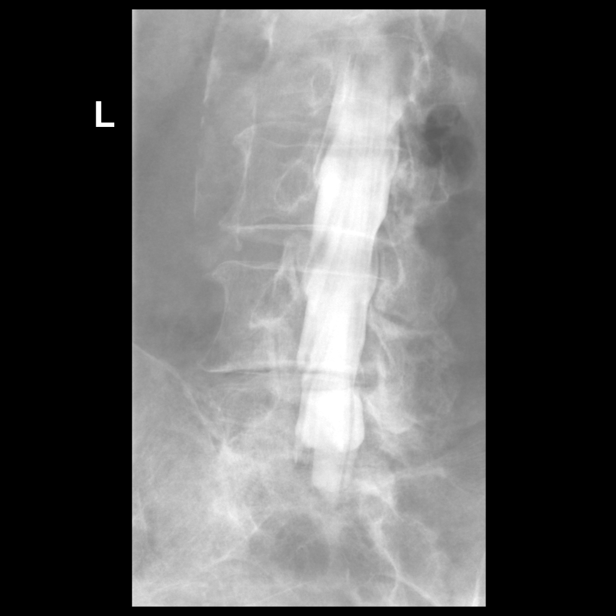

[Series 7: vasc standard · 1 of 1 slices shown (6 of 11)]
[im 1/1]
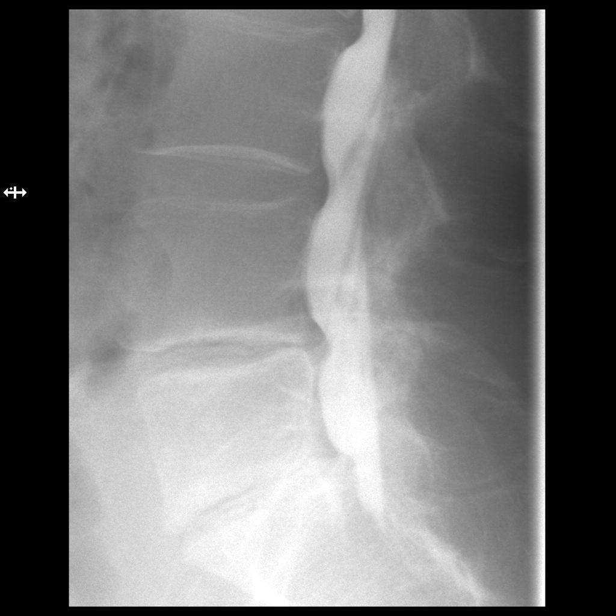

[Series 9: vasc standard · 1 of 1 slices shown (7 of 11)]
[im 1/1]
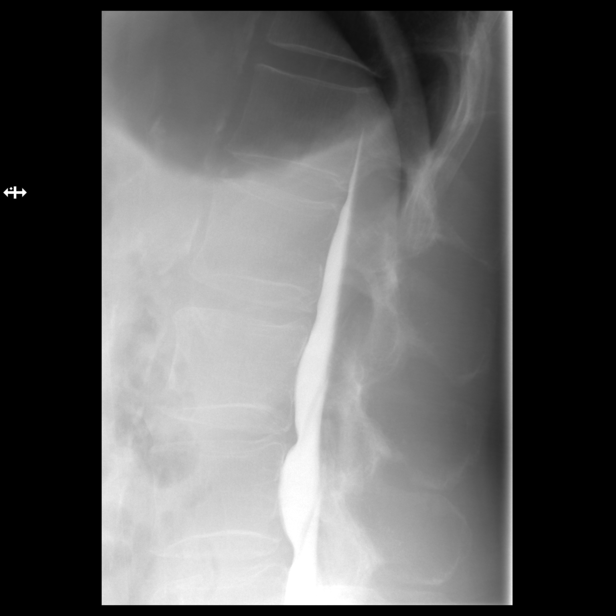

[Series 10: vasc standard · 1 of 1 slices shown (8 of 11)]
[im 1/1]
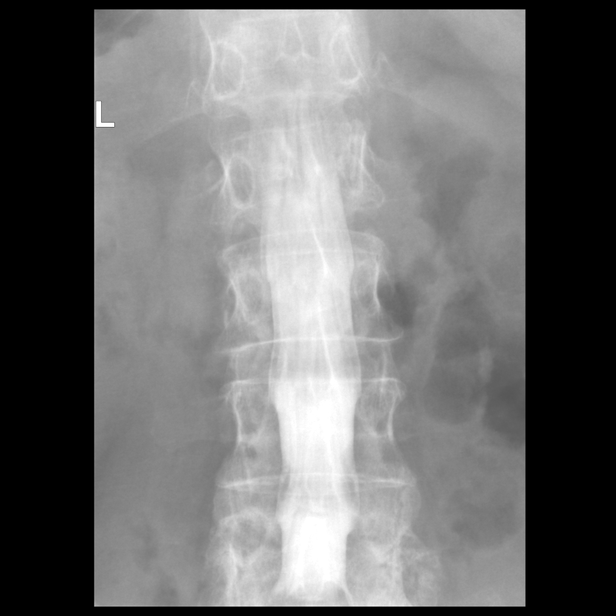

[Series 11: vasc standard · 1 of 1 slices shown (9 of 11)]
[im 1/1]
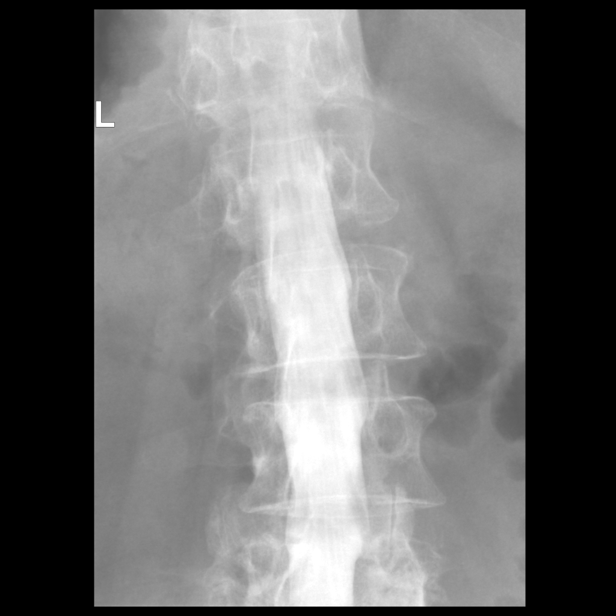

[Series 13: vasc standard · 1 of 1 slices shown (10 of 11)]
[im 1/1]
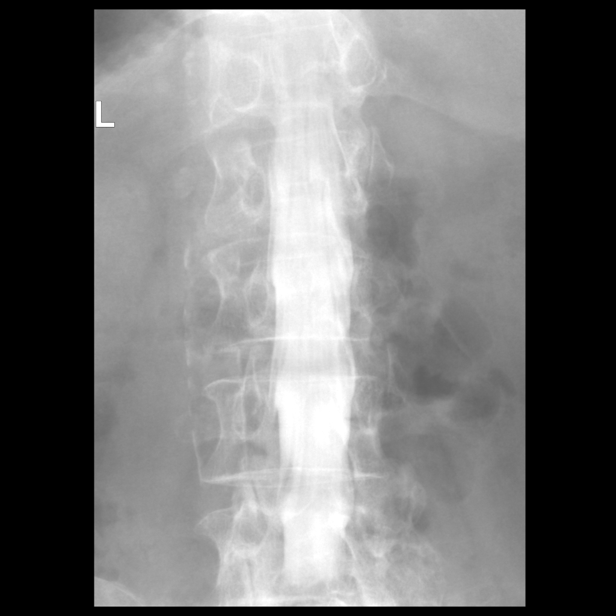

[Series 14: vasc standard · 1 of 1 slices shown (11 of 11)]
[im 1/1]
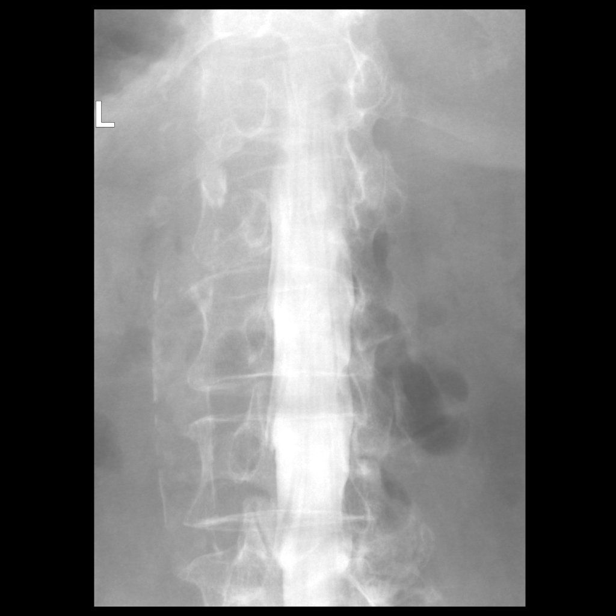

[13 of 17 positions shown; findings below may reference images not displayed]

EXAM:
LUMBAR MYELOGRAM

CT LUMBAR MYELOGRAM

FLUOROSCOPY TIME:  Radiation Exposure Index (as provided by the
fluoroscopic device): 13.0 mGy

Fluoroscopy Time:  17 seconds

Number of Acquired Images:  16

PROCEDURE:
After thorough discussion of risks and benefits of the procedure
including bleeding, infection, injury to nerves, blood vessels,
adjacent structures as well as headache and CSF leak, written and
oral informed consent was obtained. Consent was obtained by Dr.
Awa Tiger. Time out form was completed.

Patient was positioned prone on the fluoroscopy table. Local
anesthesia was provided with 1% lidocaine without epinephrine after
prepped and draped in the usual sterile fashion. Puncture was
performed at L3-L4 using a 3 1/2 inch 22-gauge spinal needle via
right interlaminar approach. Using a single pass through the dura,
the needle was placed within the thecal sac, with return of clear
CSF. 15 mL of Isovue P-HUU was injected into the thecal sac, with
normal opacification of the nerve roots and cauda equina consistent
with free flow within the subarachnoid space.

I personally performed the lumbar puncture and administered the
intrathecal contrast. I also personally supervised acquisition of
the myelogram images.
FINDINGS: LUMBAR MYELOGRAM FINDINGS:

Normal alignment. No dynamic instability. Small ventral extradural
defects from L2-L3 through L5-S1. No spinal canal stenosis.
Underfilling of the bilateral L5 and right S1 nerve roots.

CT LUMBAR MYELOGRAM FINDINGS:

Segmentation: Standard.

Alignment: Physiologic.

Vertebrae: No acute vertebral body fracture. Acute to subacute
bilateral sacral ala insufficiency fractures. Chronic appearing
non-united fracture of the right L5 transverse process.

Conus medullaris and cauda equina: Conus extends to the T12-L1
level. Conus and cauda equina appear normal.

Paraspinal and other soft tissues: Aortoiliac atherosclerotic
vascular disease.

Disc levels:

T11-T12: Negative.

T12-L1:  Negative disc.  Mild right facet arthropathy.  No stenosis.

L1-L2:  Negative.

L2-L3: Mild diffuse disc bulging and bilateral facet arthropathy. No
stenosis.

L3-L4: Mild diffuse disc bulging and mild to moderate bilateral
facet arthropathy. No stenosis.

L4-L5: Small circumferential disc osteophyte complex. Moderate to
severe right and moderate left facet arthropathy. Mild right lateral
recess stenosis. Mild left neuroforaminal stenosis. No spinal canal
or right neuroforaminal stenosis.

L5-S1: Prior left hemilaminectomy. Small circumferential disc
osteophyte complex with superimposed small right subarticular disc
protrusion contacting the descending right S1 nerve root. Moderate
left and mild right facet arthropathy. Distortion of the thecal sac
without significant spinal canal stenosis. Moderate right greater
than left neuroforaminal stenosis.
IMPRESSION: 1. Acute to subacute bilateral sacral ala insufficiency fractures.
2. Multilevel lumbar spondylosis as described above. Small right
subarticular disc protrusion at L5-S1 contacting the descending
right S1 nerve root. Moderate right greater than left L5-S1
neuroforaminal stenosis.
3. Aortic atherosclerosis ([id number]).

These results will be called to the ordering clinician or
representative by the Radiologist Assistant, and communication
documented in the PACS or zVision Dashboard.

## 2020-08-29 ENCOUNTER — Other Ambulatory Visit (HOSPITAL_COMMUNITY): Payer: Self-pay | Admitting: *Deleted

## 2020-08-30 ENCOUNTER — Other Ambulatory Visit: Payer: Self-pay

## 2020-08-30 ENCOUNTER — Ambulatory Visit (HOSPITAL_COMMUNITY)
Admission: RE | Admit: 2020-08-30 | Discharge: 2020-08-30 | Disposition: A | Discharge status: Home / Self Care | Payer: Medicare PPO | Source: Ambulatory Visit | Attending: Internal Medicine | Admitting: Internal Medicine

## 2020-08-30 DIAGNOSIS — M816 Localized osteoporosis [Lequesne]: Secondary | ICD-10-CM | POA: Insufficient documentation

## 2020-08-30 MED ORDER — DENOSUMAB 60 MG/ML ~~LOC~~ SOSY
60.0000 mg | PREFILLED_SYRINGE | Freq: Once | SUBCUTANEOUS | Status: AC
  Administered 2020-08-30: 60 mg via SUBCUTANEOUS

## 2020-08-30 MED ORDER — DENOSUMAB 60 MG/ML ~~LOC~~ SOSY
PREFILLED_SYRINGE | SUBCUTANEOUS | Status: AC
  Filled 2020-08-30: qty 1

## 2022-09-26 ENCOUNTER — Emergency Department (HOSPITAL_COMMUNITY)
Admission: EM | Admit: 2022-09-26 | Discharge: 2022-09-26 | Disposition: A | Discharge status: Home / Self Care | Payer: Medicare PPO | Attending: Emergency Medicine | Admitting: Emergency Medicine

## 2022-09-26 ENCOUNTER — Emergency Department (HOSPITAL_COMMUNITY): Payer: Medicare PPO

## 2022-09-26 ENCOUNTER — Encounter (HOSPITAL_COMMUNITY): Payer: Self-pay | Admitting: Emergency Medicine

## 2022-09-26 ENCOUNTER — Other Ambulatory Visit: Payer: Self-pay

## 2022-09-26 DIAGNOSIS — S2232XA Fracture of one rib, left side, initial encounter for closed fracture: Secondary | ICD-10-CM | POA: Insufficient documentation

## 2022-09-26 DIAGNOSIS — W19XXXA Unspecified fall, initial encounter: Secondary | ICD-10-CM | POA: Diagnosis not present

## 2022-09-26 DIAGNOSIS — I7121 Aneurysm of the ascending aorta, without rupture: Secondary | ICD-10-CM | POA: Diagnosis not present

## 2022-09-26 DIAGNOSIS — S299XXA Unspecified injury of thorax, initial encounter: Secondary | ICD-10-CM | POA: Diagnosis present

## 2022-09-26 DIAGNOSIS — Z7982 Long term (current) use of aspirin: Secondary | ICD-10-CM | POA: Insufficient documentation

## 2022-09-26 DIAGNOSIS — J45909 Unspecified asthma, uncomplicated: Secondary | ICD-10-CM | POA: Diagnosis not present

## 2022-09-26 DIAGNOSIS — Z953 Presence of xenogenic heart valve: Secondary | ICD-10-CM | POA: Insufficient documentation

## 2022-09-26 LAB — CBC
HCT: 39.3 % (ref 36.0–46.0)
Hemoglobin: 12.7 g/dL (ref 12.0–15.0)
MCH: 30.5 pg (ref 26.0–34.0)
MCHC: 32.3 g/dL (ref 30.0–36.0)
MCV: 94.2 fL (ref 80.0–100.0)
Platelets: 220 10*3/uL (ref 150–400)
RBC: 4.17 MIL/uL (ref 3.87–5.11)
RDW: 12.8 % (ref 11.5–15.5)
WBC: 5.6 10*3/uL (ref 4.0–10.5)
nRBC: 0 % (ref 0.0–0.2)

## 2022-09-26 LAB — BASIC METABOLIC PANEL
Anion gap: 7 (ref 5–15)
BUN: 19 mg/dL (ref 8–23)
CO2: 28 mmol/L (ref 22–32)
Calcium: 8.9 mg/dL (ref 8.9–10.3)
Chloride: 103 mmol/L (ref 98–111)
Creatinine, Ser: 1.02 mg/dL — ABNORMAL HIGH (ref 0.44–1.00)
GFR, Estimated: 55 mL/min — ABNORMAL LOW (ref >60)
Glucose, Bld: 130 mg/dL — ABNORMAL HIGH (ref 70–99)
Potassium: 3.7 mmol/L (ref 3.5–5.1)
Sodium: 138 mmol/L (ref 135–145)

## 2022-09-26 LAB — TROPONIN I (HIGH SENSITIVITY)
Troponin I (High Sensitivity): 6 ng/L (ref <18)
Troponin I (High Sensitivity): 6 ng/L (ref <18)

## 2022-09-26 LAB — D-DIMER, QUANTITATIVE: D-Dimer, Quant: 1.8 ug/mL-FEU — ABNORMAL HIGH (ref 0.00–0.50)

## 2022-09-26 MED ORDER — ACETAMINOPHEN 500 MG PO TABS: 500.0000 mg | ORAL_TABLET | Freq: Four times a day (QID) | ORAL | 0 refills | Status: AC | PRN

## 2022-09-26 MED ORDER — IOHEXOL 350 MG/ML SOLN
75.0000 mL | Freq: Once | INTRAVENOUS | Status: AC | PRN
  Administered 2022-09-26: 75 mL via INTRAVENOUS

## 2022-09-26 NOTE — ED Notes (Signed)
Pt ambulated to the bathroom w/ one stand by assist for safety.  No issues at this time.

## 2022-09-26 NOTE — ED Provider Notes (Signed)
[facility] Provider Note   CSN: [id number] Arrival date & time: 09/26/22  1543     History  Chief Complaint  Patient presents with   Chest Pain    Diamond Bowers is a 81 y.o. female.  The history is provided by the patient, the spouse and medical records. No language interpreter was used.  Chest Pain    This is an 81 year old female significant history of breast cancer status post mastectomy, asthma, GERD, fibromyalgia, osteoporosis, anxiety, accompanied by husband to the ER for evaluation of chest pain.  History obtained through patient and through husband who is at bedside.  2 to 3 days ago patient had a mechanical fall.  She landed on her buttock but she did not hit her chest.  Husband was there when she fell.  No loss of consciousness.  She has been complaining of pain to the left side of her chest since.  Sometimes patient states the pain is constant but other times patient states pain comes and goes.  Pain is mild but does radiates to her left shoulder.  No associated lightheadedness dizziness nausea shortness of breath fever chills productive cough.  She did not notice any rash.  No associate numbness.  She has had recurrent pain to the left chest in the past and has been evaluated multiple times including cardiac stress test and was told that it was normal.  Suspect pain likely due to scar tissue from her prior mastectomy.  She has been taking Tylenol at home with some improvement.  Patient does not endorse any cough or hemoptysis.  Nothing seems to make it better or worse.  Home Medications Prior to Admission medications   Medication Sig Start Date End Date Taking? Authorizing Provider  aspirin 81 MG chewable tablet Chew 81 mg by mouth daily.    [provider]  budesonide-formoterol (SYMBICORT) 160-4.5 MCG/ACT inhaler Inhale 2 puffs into the lungs daily as needed (shortness of breath).     [provider]  Cholecalciferol (VITAMIN D) 50  MCG (2000 UT) tablet Take 2,000 Units by mouth daily.    [provider]  ezetimibe (ZETIA) 10 MG tablet Take 10 mg by mouth daily.    [provider]  fish oil-omega-3 fatty acids 1000 MG capsule Take 1 g by mouth daily.     [provider]  galantamine (RAZADYNE) 8 MG tablet Take 8 mg by mouth 2 (two) times daily.    [provider]  guaiFENesin (MUCINEX) 600 MG 12 hr tablet Take 600 mg by mouth 2 (two) times daily.    [provider]  montelukast (SINGULAIR) 10 MG tablet Take 10 mg by mouth daily.     [provider]  Multiple Vitamin (MULTIVITAMIN) capsule Take 1 capsule by mouth daily.    [provider]  rosuvastatin (CRESTOR) 10 MG tablet Take 10 mg by mouth daily.    [provider]      Allergies    Statins    Review of Systems   Review of Systems  Cardiovascular:  Positive for chest pain.  All other systems reviewed and are negative.   Physical Exam Updated Vital Signs BP (!) 148/81   Pulse 87   Temp 98.9 F (37.2 C) (Oral)   Resp 16   Ht '5\' 4"'$  (1.626 m)   Wt 59 kg   SpO2 93%   BMI 22.31 kg/m  Physical Exam Vitals and nursing note reviewed.  Constitutional:  General: She is not in acute distress.    Appearance: She is well-developed.  HENT:     Head: Atraumatic.  Eyes:     Conjunctiva/sclera: Conjunctivae normal.  Cardiovascular:     Rate and Rhythm: Normal rate and regular rhythm.     Pulses: Normal pulses.     Heart sounds: Normal heart sounds.  Pulmonary:     Effort: Pulmonary effort is normal.  Chest:     Chest wall: Tenderness (Mild tenderness to mid sternum and less chest wall on palpation without any crepitus emphysema no overlying skin changes. L Mastectomy, right mastectomy with spacer) present.  Musculoskeletal:     Cervical back: Neck supple.  Skin:    Findings: No rash.  Neurological:     Mental Status: She is alert.  Psychiatric:        Mood and Affect: Mood  normal.     ED Results / Procedures / Treatments   Labs (all labs ordered are listed, but only abnormal results are displayed) Labs Reviewed  BASIC METABOLIC PANEL - Abnormal; Notable for the following components:      Result Value   Glucose, Bld 130 (*)    Creatinine, Ser 1.02 (*)    GFR, Estimated 55 (*)    All other components within normal limits  D-DIMER, QUANTITATIVE - Abnormal; Notable for the following components:   D-Dimer, Quant 1.80 (*)    All other components within normal limits  CBC  TROPONIN I (HIGH SENSITIVITY)  TROPONIN I (HIGH SENSITIVITY)    EKG EKG Interpretation  Date/Time:  Friday September 26 2022 15:56:58 EST Ventricular Rate:  83 PR Interval:  146 QRS Duration: 114 QT Interval:  434 QTC Calculation: 509 R Axis:   -53 Text Interpretation: Normal sinus rhythm Low voltage QRS Incomplete right bundle branch block Left anterior fascicular block Nonspecific T wave abnormality Prolonged QT Abnormal ECG When compared with ECG of 09-Nov-2012 10:07, Left anterior fascicular block is now Present Incomplete right bundle branch block is now Present Confirmed by Isla Pence [id number]) on 09/26/2022 6:09:07 PM  Radiology CT Angio Chest PE W and/or Wo Contrast  Result Date: 09/26/2022 CLINICAL DATA:  Left chest pain, positive D-dimer EXAM: CT ANGIOGRAPHY CHEST WITH CONTRAST TECHNIQUE: Multidetector CT imaging of the chest was performed using the standard protocol during bolus administration of intravenous contrast. Multiplanar CT image reconstructions and MIPs were obtained to evaluate the vascular anatomy. RADIATION DOSE REDUCTION: This exam was performed according to the departmental dose-optimization program which includes automated exposure control, adjustment of the mA and/or kV according to patient size and/or use of iterative reconstruction technique. CONTRAST:  48m OMNIPAQUE IOHEXOL 350 MG/ML SOLN COMPARISON:  Chest radiographs done today FINDINGS:  Cardiovascular: There is homogeneous enhancement in thoracic aorta. There are scattered calcifications in thoracic aorta and its major branches. There is ectasia of ascending thoracic aorta measuring 3.8 cm. There are no intraluminal filling defects in pulmonary artery branches. Coronary artery calcifications are seen. Mediastinum/Nodes: No significant lymphadenopathy is seen. Lungs/Pleura: There is no focal pulmonary consolidation. There is no pleural effusion or pneumothorax. Upper Abdomen: Small hiatal hernia is seen. There is mild distention of thoracic esophagus. There is small amount of fluid in the thoracic esophagus. Musculoskeletal: There is recent, minimally displaced fracture in the anterolateral aspect of left fifth rib. Deformity in the anterolateral aspect of left third and fourth ribs may suggest old fractures. There are old fractures in the posterior left third, fourth and tenth ribs. Old fractures are seen  in right ninth and tenth ribs. There is previous left mastectomy. There is reconstruction prosthesis in right breast. Review of the MIP images confirms the above findings. IMPRESSION: There is no evidence of pulmonary artery embolism. There is no evidence of thoracic aortic dissection. Coronary artery calcifications are seen. There is ectasia of ascending thoracic aorta measuring 3.8 cm. Recommend annual imaging followup by CTA or MRA. This recommendation follows 2010 ACCF/AHA/AATS/ACR/ASA/SCA/SCAI/SIR/STS/SVM Guidelines for the Diagnosis and Management of Patients with Thoracic Aortic Disease. Circulation. 2010; 121: Y706-C376. Aortic aneurysm NOS (ICD10-I71.9) Recent, minimally displaced fracture is seen in the anterolateral aspect of left fifth rib. Deformities in the anterolateral aspect of left third and fourth ribs suggest possible old fractures. There are old fractures in posterior left third, fourth and tenth ribs and posterior right ninth and tenth ribs. There is no focal pulmonary  consolidation. There is no pleural effusion or pneumothorax. Small hiatal hernia. There is fluid in the lumen of thoracic esophagus suggesting gastroesophageal reflux. Electronically Signed   By: Elmer Picker M.D.   On: 09/26/2022 20:32   DG Chest 2 View  Result Date: 09/26/2022 CLINICAL DATA:  Chest pain. EXAM: CHEST - 2 VIEW COMPARISON:  None Available. FINDINGS: The cardiomediastinal contours are normal. Atherosclerosis of the thoracic aorta. Pulmonary vasculature is normal. No consolidation, pleural effusion, or pneumothorax. Right axillary surgical clips. There are remote fractures of left third and fourth ribs with callus formation. No acute osseous abnormalities are seen. IMPRESSION: No acute cardiopulmonary disease. Electronically Signed   By: Keith Rake M.D.   On: 09/26/2022 18:50    Procedures Procedures    Medications Ordered in ED Medications  iohexol (OMNIPAQUE) 350 MG/ML injection 75 mL (75 mLs Intravenous Contrast Given 09/26/22 2015)    ED Course/ Medical Decision Making/ A&P                           Medical Decision Making Amount and/or Complexity of Data Reviewed Labs: ordered. Radiology: ordered.  Risk OTC drugs. Prescription drug management.   BP (!) 162/75   Pulse 72   Temp 98.4 F (36.9 C)   Resp 18   Ht '5\' 4"'$  (1.626 m)   Wt 59 kg   SpO2 99%   BMI 22.31 kg/m   6:48 PM This is an 81 year old female significant history of breast cancer status post mastectomy, asthma, GERD, fibromyalgia, osteoporosis, anxiety, accompanied by husband to the ER for evaluation of chest pain.  History obtained through patient and through husband who is at bedside.  2 to 3 days ago patient had a mechanical fall.  She landed on her buttock but she did not hit her chest.  Husband was there when she fell.  No loss of consciousness.  She has been complaining of pain to the left side of her chest since.  Sometimes patient states the pain is constant but other times  patient states pain comes and goes.  Pain is mild but does radiates to her left shoulder.  No associated lightheadedness dizziness nausea shortness of breath fever chills productive cough.  She did not notice any rash.  No associate numbness.  She has had recurrent pain to the left chest in the past and has been evaluated multiple times including cardiac stress test and was told that it was normal.  Suspect pain likely due to scar tissue from her prior mastectomy.  She has been taking Tylenol at home with some improvement.  Patient does not  endorse any cough or hemoptysis.  Nothing seems to make it better or worse.  -Labs ordered, independently viewed and interpreted by me.  Labs remarkable for d-dimer 1.8. chest CTA ordered.  Normal delta trop,doubt ACS, electrolytes panel are reassuring.  Normal WBC -The patient was maintained on a cardiac monitor.  I personally viewed and interpreted the cardiac monitored which showed an underlying rhythm of: NSR -Imaging independently viewed and interpreted by me and I agree with radiologist's interpretation.  Result remarkable for Chest CTA showing no evidence of PE, but pt does have minimally displaced fx of L fifth rib and a few old healed rib fx.  Also evidence of ascending thoracic aorta measuring 3.8cm.  recommend repeat CTA annually for surveillance.  -This patient presents to the ED for concern of chest pain, this involves an extensive number of treatment options, and is a complaint that carries with it a high risk of complications and morbidity.  The differential diagnosis includes chest wall pain, PE, ACS, rib fx, pna, ptx -Co morbidities that complicate the patient evaluation includes dementia, recurrent falls -Reevaluation of the patient after these medicines showed that the patient improved -PCP office notes or outside notes reviewed -Discussion with attending Dr. Gilford Raid -Escalation to admission/observation considered: patients feels much better, is  comfortable with discharge, and will follow up with PCP -Prescription medication considered, patient comfortable with tylenol and incentive spirometer -Social Determinant of Health considered  9:20 PM On exam patient has tenderness to her left chest wall.  Labs remarkable for elevated D-dimer of 1.8 therefore chest CT angiogram was obtained to rule out PE.  Fortunately no evidence of PE however patient does have evidence of left rib fracture likely contributing to her ongoing chest pain.  She also has several old rib fracture as well.  No pneumothorax no pneumonia.  Incidentally a 3.8 cm AAA were noted.  Patient recommended for annual CT surveillance.  Patient discharged home with incentive spirometer, and supportive care.  Return precaution given.         Final Clinical Impression(s) / ED Diagnoses Final diagnoses:  Closed fracture of one rib of left side, initial encounter  Aneurysm of ascending aorta without rupture ([place])    Rx / DC Orders ED Discharge Orders          Ordered    acetaminophen (TYLENOL) 500 MG tablet  Every 6 hours PRN        09/26/22 2115              Domenic Moras, PA-C 09/26/22 2123    Isla Pence, MD 09/27/22 [phone]

## 2022-09-26 NOTE — ED Triage Notes (Signed)
Pt to ED c/o left chest pain. HX left breast mastectomy in 2019, has had hx of left chest pain before after surgery, Reports pain started yesterday, reports no relief with OTC medications. Denies SHOB. Denies aggravating factors

## 2022-09-26 NOTE — Discharge Instructions (Addendum)
You have been evaluated for your chest pain.  It appears that you have a broken rib on the left side causing pain.  Please use incentive spirometer hourly for the next several days to decrease risk of developing pneumonia.  You may use a pillow to brace against your chest with coughing or laughing to decrease discomfort.  Take Tylenol as needed for pain.  Follow-up with your doctor for further care. You also have an aneurysm.  Please have a repeat CT scan in 1 year to monitor this aneurysm

## 2022-09-26 NOTE — ED Notes (Signed)
Pt to CT

## 2022-09-26 NOTE — ED Notes (Signed)
Assumed care of pt who is alert and oriented at base level ("some demential")  Pt was aware of where she was and why, she stated "I just want to go home."  Pt is very compliant and understood need for IV which was placed.  Gave warm blankets to both pt and visitor.
